# Patient Record
Sex: Female | Born: 1990 | Race: Black or African American | Hispanic: No | Marital: Married | Smoking: Never smoker
Health system: Southern US, Community
[De-identification: ages and names within clinical notes are randomized; demographics above are authoritative.]

## PROBLEM LIST (undated history)

## (undated) ENCOUNTER — Inpatient Hospital Stay (HOSPITAL_COMMUNITY): Payer: Self-pay

## (undated) DIAGNOSIS — D649 Anemia, unspecified: Secondary | ICD-10-CM

## (undated) HISTORY — PX: TONSILLECTOMY: SUR1361

---

## 2016-02-14 ENCOUNTER — Inpatient Hospital Stay (HOSPITAL_COMMUNITY): Payer: Medicaid Other

## 2016-02-14 ENCOUNTER — Encounter (HOSPITAL_COMMUNITY): Payer: Self-pay

## 2016-02-14 ENCOUNTER — Inpatient Hospital Stay (HOSPITAL_COMMUNITY)
Admission: AD | Admit: 2016-02-14 | Discharge: 2016-02-14 | Disposition: A | Discharge status: Home / Self Care | Payer: Medicaid Other | Source: Ambulatory Visit | Attending: Obstetrics & Gynecology | Admitting: Obstetrics & Gynecology

## 2016-02-14 DIAGNOSIS — R102 Pelvic and perineal pain: Secondary | ICD-10-CM | POA: Diagnosis present

## 2016-02-14 DIAGNOSIS — R109 Unspecified abdominal pain: Secondary | ICD-10-CM

## 2016-02-14 DIAGNOSIS — O26891 Other specified pregnancy related conditions, first trimester: Secondary | ICD-10-CM | POA: Diagnosis not present

## 2016-02-14 DIAGNOSIS — N8311 Corpus luteum cyst of right ovary: Secondary | ICD-10-CM | POA: Insufficient documentation

## 2016-02-14 DIAGNOSIS — Z3A01 Less than 8 weeks gestation of pregnancy: Secondary | ICD-10-CM | POA: Diagnosis present

## 2016-02-14 DIAGNOSIS — O26899 Other specified pregnancy related conditions, unspecified trimester: Secondary | ICD-10-CM

## 2016-02-14 LAB — URINE MICROSCOPIC-ADD ON

## 2016-02-14 LAB — URINALYSIS, ROUTINE W REFLEX MICROSCOPIC
GLUCOSE, UA: NEGATIVE mg/dL
Ketones, ur: 15 mg/dL — AB
Leukocytes, UA: NEGATIVE
Nitrite: NEGATIVE
PROTEIN: NEGATIVE mg/dL
pH: 6 (ref 5.0–8.0)

## 2016-02-14 LAB — POCT PREGNANCY, URINE: PREG TEST UR: POSITIVE — AB

## 2016-02-14 LAB — HCG, QUANTITATIVE, PREGNANCY: hCG, Beta Chain, Quant, S: 735 m[IU]/mL — ABNORMAL HIGH (ref <5)

## 2016-02-14 NOTE — MAU Provider Note (Signed)
History   [id number]   Chief Complaint  Patient presents with  . Possible Pregnancy    HPI Sabrina Dean is a 25 y.o. female  G5P4004 at 414w3d by LMP here with report of lower pelvic pain x 2 days.  Pain is described as cramping and mild.  Denies vaginal discharge or vaginal bleeding.  Utilizes Natural Family Planning method for birth control.    Patient's last menstrual period was 01/14/2016 (exact date).  OB History  Gravida Para Term Preterm AB Living  5 4 4     4   SAB TAB Ectopic Multiple Live Births          4    # Outcome Date GA Lbr Len/2nd Weight Sex Delivery Anes PTL Lv  5 Current           4 Term 11/21/13    M CS-LTranv   LIV  3 Term 09/07/12    M CS-LTranv   LIV  2 Term 03/06/09    M CS-LTranv   LIV  1 Term 07/15/06    M CS-LTranv   LIV      No past medical history on file.  No family history on file.  Social History   Social History  . Marital status: Married    Spouse name: N/A  . Number of children: N/A  . Years of education: N/A   Social History Main Topics  . Smoking status: Not on file  . Smokeless tobacco: Not on file  . Alcohol use Not on file  . Drug use: Unknown  . Sexual activity: Not on file   Other Topics Concern  . Not on file   Social History Narrative  . No narrative on file    No Known Allergies  No current facility-administered medications on file prior to encounter.    No current outpatient prescriptions on file prior to encounter.     Review of Systems  Constitutional: Negative for fever.  Gastrointestinal: Negative for constipation, nausea and vomiting.  Genitourinary: Positive for pelvic pain (cramping). Negative for dysuria, flank pain, frequency and vaginal bleeding.  Neurological: Negative for headaches.  All other systems reviewed and are negative.    Physical Exam   Vitals:   02/14/16 2218  BP: 115/66  Pulse: 70  Resp: 16  Temp: 98.3 F (36.8 C)  TempSrc: Oral  SpO2: 99%  Physical Exam  Constitutional: She is oriented to person, place, and time. She appears well-developed and well-nourished. No distress.  HENT:  Head: Normocephalic.  Neck: Normal range of motion. Neck supple.  Cardiovascular: Normal rate, regular rhythm and normal heart sounds.   Respiratory: Effort normal and breath sounds normal. No respiratory distress.  GI: Soft. She exhibits no mass. There is no tenderness. There is no rebound and no guarding.  Genitourinary: Uterus is enlarged. Right adnexum displays no mass, no tenderness and no fullness. Left adnexum displays no mass, no tenderness and no fullness. No bleeding in the vagina.  Musculoskeletal: Normal range of motion.  Neurological: She is alert and oriented to person, place, and time.  Skin: Skin is warm and dry.    MAU Course  Procedures  MDM Results for orders placed or performed during the hospital encounter of 02/14/16 (from the past 24 hour(s))  Urinalysis, Routine w reflex microscopic (not at [facility])     Status: Abnormal   Collection Time: 02/14/16  7:56 PM  Result Value Ref Range   Color, Urine YELLOW YELLOW   APPearance CLEAR CLEAR   Specific Gravity, Urine >1.030 (H) 1.005 - 1.030   pH 6.0 5.0 - 8.0   Glucose, UA NEGATIVE NEGATIVE mg/dL   Hgb urine dipstick TRACE (A) NEGATIVE   Bilirubin Urine SMALL (A) NEGATIVE   Ketones, ur 15 (A) NEGATIVE mg/dL   Protein, ur NEGATIVE NEGATIVE mg/dL   Nitrite NEGATIVE NEGATIVE   Leukocytes, UA NEGATIVE NEGATIVE  Urine microscopic-add on     Status: Abnormal   Collection Time: 02/14/16  7:56 PM  Result Value Ref Range   Squamous Epithelial / LPF 6-30 (A) NONE SEEN   WBC, UA 0-5 0 - 5 WBC/hpf   RBC / HPF 0-5 0 - 5 RBC/hpf   Bacteria, UA FEW (A)  NONE SEEN  Pregnancy, urine POC     Status: Abnormal   Collection Time: 02/14/16  8:19 PM  Result Value Ref Range   Preg Test, Ur POSITIVE (A) NEGATIVE  hCG, quantitative, pregnancy     Status: Abnormal   Collection Time: 02/14/16  9:11 PM  Result Value Ref Range   hCG, Beta Chain, Quant, S 735 (H) <5 mIU/mL    Ultrasound: IMPRESSION: No visible gestational sac. No abnormal pelvic fluid collections. Careful follow-up is necessary.  Assessment and Plan  Pregnancy of Unknown Location  Plan: [facility] Wednesday morning for follow-up BHCG. Reviewed ectopic precautions   Marlis EdelsonWalidah N Karim, CNM 02/14/2016 8:43 PM

## 2016-02-14 NOTE — Discharge Instructions (Signed)

## 2016-02-14 NOTE — MAU Note (Signed)
Pt presents stating she thinks she might be pregnant and is having cramps. LMP 01/14/16. Positive pregnancy test at home. Denies other vaginal discharge.

## 2016-02-17 ENCOUNTER — Telehealth: Payer: Self-pay

## 2016-02-17 ENCOUNTER — Ambulatory Visit: Payer: Medicaid Other

## 2016-02-17 NOTE — Telephone Encounter (Signed)
Called patient to advise her she miss her appointment today. I have rescheduled her appointment for repeat STAT for 02/18/2016. Patient verbalizes understanding.

## 2016-02-18 ENCOUNTER — Telehealth: Payer: Self-pay | Admitting: General Practice

## 2016-02-18 ENCOUNTER — Other Ambulatory Visit: Payer: Medicaid Other | Admitting: General Practice

## 2016-02-18 DIAGNOSIS — O3680X0 Pregnancy with inconclusive fetal viability, not applicable or unspecified: Secondary | ICD-10-CM

## 2016-02-18 LAB — HCG, QUANTITATIVE, PREGNANCY: HCG, BETA CHAIN, QUANT, S: 4308 m[IU]/mL — AB (ref <5)

## 2016-02-18 NOTE — Telephone Encounter (Signed)
Called patient with bhcg results & informed her of need for ultrasound & ultrasound appt 11/28 @ 11am. Patient verbalized understanding to all & had no questions

## 2016-02-18 NOTE — Progress Notes (Signed)
Patient here for stat bhcg. Patient reports no bleeding and pain has improved but occasionally has cramps like a period. Told patient we will contact her in a couple hours by phone for results. Patient verbalized understanding & had no questions. Spoke with Dr Adrian BlackwaterStinson regarding results who agrees to favorable rise in bhcg levels & need for follow up ultrasound in 10 days. Will call patient with results

## 2016-03-01 ENCOUNTER — Ambulatory Visit (HOSPITAL_COMMUNITY)
Admission: RE | Admit: 2016-03-01 | Discharge: 2016-03-01 | Disposition: A | Discharge status: Home / Self Care | Payer: Medicaid Other | Source: Ambulatory Visit | Attending: Family Medicine | Admitting: Family Medicine

## 2016-03-01 ENCOUNTER — Inpatient Hospital Stay (HOSPITAL_COMMUNITY)
Admission: AD | Admit: 2016-03-01 | Discharge: 2016-03-01 | Disposition: A | Discharge status: Home / Self Care | Payer: Medicaid Other | Source: Ambulatory Visit | Attending: Family Medicine | Admitting: Family Medicine

## 2016-03-01 ENCOUNTER — Encounter (HOSPITAL_COMMUNITY): Payer: Self-pay

## 2016-03-01 DIAGNOSIS — Z5189 Encounter for other specified aftercare: Secondary | ICD-10-CM | POA: Insufficient documentation

## 2016-03-01 DIAGNOSIS — R109 Unspecified abdominal pain: Secondary | ICD-10-CM | POA: Insufficient documentation

## 2016-03-01 DIAGNOSIS — O209 Hemorrhage in early pregnancy, unspecified: Secondary | ICD-10-CM | POA: Insufficient documentation

## 2016-03-01 DIAGNOSIS — O3680X0 Pregnancy with inconclusive fetal viability, not applicable or unspecified: Secondary | ICD-10-CM

## 2016-03-01 DIAGNOSIS — Z3A01 Less than 8 weeks gestation of pregnancy: Secondary | ICD-10-CM | POA: Diagnosis not present

## 2016-03-01 DIAGNOSIS — O26891 Other specified pregnancy related conditions, first trimester: Secondary | ICD-10-CM | POA: Diagnosis not present

## 2016-03-01 LAB — CBC
HEMATOCRIT: 32.1 % — AB (ref 36.0–46.0)
HEMOGLOBIN: 10.3 g/dL — AB (ref 12.0–15.0)
MCH: 26.3 pg (ref 26.0–34.0)
MCHC: 32.1 g/dL (ref 30.0–36.0)
MCV: 82.1 fL (ref 78.0–100.0)
Platelets: 349 10*3/uL (ref 150–400)
RBC: 3.91 MIL/uL (ref 3.87–5.11)
RDW: 16 % — AB (ref 11.5–15.5)
WBC: 7.1 10*3/uL (ref 4.0–10.5)

## 2016-03-01 LAB — ABO/RH: ABO/RH(D): O POS

## 2016-03-01 LAB — HCG, QUANTITATIVE, PREGNANCY: hCG, Beta Chain, Quant, S: 698 m[IU]/mL — ABNORMAL HIGH (ref <5)

## 2016-03-01 NOTE — MAU Provider Note (Signed)
History   [id number]   Chief Complaint  Patient presents with  . Follow-up    HPI Sabrina Dean is a 25 y.o. female [phone] here for follow-up. Patient was initially seen in MAU on 11/12 for abdominal cramping in pregnancy. BHCG that day was 735 & ultrasound shows no IUP or adnexal mass. [facility] on 11/16, which was 4308. Outpatient ultrasound for viability scheduled for today. Per ultrasonographer; pt took abortion pill & should be seen in MAU prior to having an ultrasound performed.  Per patient; [facility] where they confirmed she was [redacted] weeks pregnant by ultrasound. States she took the pills that day and had an episode of heavy bleeding the next day when she thought she passed the pregnancy. Reports light bleeding since then. Denies abdominal pain or fever.   Patient's last menstrual period was 01/14/2016 (exact date).  OB History  Gravida Para Term Preterm AB Living  5 4 4     4   SAB TAB Ectopic Multiple Live Births          4    # Outcome Date GA Lbr Len/2nd Weight Sex Delivery Anes PTL Lv  5 Current           4 Term 11/21/13    M CS-LTranv   LIV  3 Term 09/07/12    M CS-LTranv   LIV  2 Term 03/06/09    M CS-LTranv   LIV  1 Term 07/15/06    M CS-LTranv   LIV     Complications: Failure to Progress in First Stage      No past medical history on file.  No family history on file.  Social History   Social History  . Marital status: Married    Spouse name: N/A  . Number of children: N/A  . Years of education: N/A   Social History Main Topics  . Smoking status: Not on file  . Smokeless tobacco: Not on file  . Alcohol use Not on file  . Drug use: Unknown  . Sexual activity: Not on file   Other Topics Concern  . Not on file   Social History Narrative  . No narrative on file    No Known Allergies  No current facility-administered medications on file prior to encounter.    No current outpatient prescriptions on  file prior to encounter.     Physical Exam   Vitals:   03/01/16 1249  BP: 125/82  Pulse: (!) 55  Resp: 16  Temp: 98.2 F (36.8 C)  TempSrc: Oral    Physical Exam  Nursing note and vitals reviewed. Constitutional: She is oriented to person, place, and time. She appears well-developed and well-nourished. No distress.  HENT:  Head: Normocephalic and atraumatic.  Eyes: Conjunctivae are normal. Right eye exhibits no discharge. Left eye exhibits no discharge. No scleral icterus.  Neck: Normal range of motion.  Respiratory: Effort normal. No respiratory distress.  GI: Soft. There is no tenderness.  Neurological: She is alert and oriented to person, place, and time.  Skin: Skin is warm and dry. She is not diaphoretic.  Psychiatric: She has a normal mood and affect. Her behavior is normal. Judgment and thought content normal.    MAU Course  Procedures Results for orders placed or performed during the hospital encounter of 03/01/16 (from the past 24 hour(s))  hCG, quantitative, pregnancy     Status: Abnormal   Collection Time: 03/01/16  1:07 PM  Result Value Ref Range   hCG, Beta Chain, Quant, S 698 (H) <5 mIU/mL  CBC     Status: Abnormal   Collection Time: 03/01/16  1:07 PM  Result Value Ref Range   WBC 7.1 4.0 - 10.5 K/uL   RBC 3.91 3.87 - 5.11 MIL/uL   Hemoglobin 10.3 (L) 12.0 - 15.0 g/dL   HCT 16.132.1 (L) 09.636.0 - 04.546.0 %   MCV 82.1 78.0 - 100.0 fL   MCH 26.3 26.0 - 34.0 pg   MCHC 32.1 30.0 - 36.0 g/dL   RDW 40.916.0 (H) 81.111.5 - 91.415.5 %   Platelets 349 150 - 400 K/uL  ABO/Rh     Status: None   Collection Time: 03/01/16  1:07 PM  Result Value Ref Range   ABO/RH(D) O POS     MDM CBC, abo/rh, BHCG O positive BHCG has dropped from previous visit. Will have pt f/u in clinic for BHCG in 1 week Assessment and Plan  25 y.o. [id number] at 3881w5d wks Pregnancy Follow-up BHCG 1. Vaginal bleeding in pregnancy, first trimester   2. Follow-up visit after therapeutic abortion     P: Discharge home Discussed reasons to return to MAU Pelvic rest F/u bhcg 12/5   Judeth HornErin Sahalie Beth, NP 03/01/2016 12:50 PM

## 2016-03-01 NOTE — MAU Note (Signed)
Feeling well.Here regarding f/u US.  Took medication on 11/21. ([facility]- abortion by pill)

## 2016-03-01 NOTE — Discharge Instructions (Signed)
Return to care  °· If you have heavier bleeding that soaks through more that 2 pads per hour for an hour or more °· If you bleed so much that you feel like you might pass out or you do pass out °· If you have significant abdominal pain that is not improved with Tylenol  °· If you develop a fever > 100.5 ° °

## 2016-03-08 ENCOUNTER — Ambulatory Visit: Payer: Medicaid Other

## 2016-03-08 DIAGNOSIS — O3680X0 Pregnancy with inconclusive fetal viability, not applicable or unspecified: Secondary | ICD-10-CM

## 2016-03-08 LAB — HCG, QUANTITATIVE, PREGNANCY: hCG, Beta Chain, Quant, S: 313 m[IU]/mL — ABNORMAL HIGH (ref <5)

## 2016-03-08 NOTE — Progress Notes (Signed)
Patient presented to office today for STAT BHCG. Patient stated she is having some mild cramping at this time. Results reviewed with provider who suggest patient come back in next week for a repeat quant check. Patient has been schedule and voice understanding.

## 2016-03-11 ENCOUNTER — Other Ambulatory Visit: Payer: Medicaid Other

## 2016-03-11 DIAGNOSIS — O3680X0 Pregnancy with inconclusive fetal viability, not applicable or unspecified: Secondary | ICD-10-CM

## 2016-03-11 NOTE — Progress Notes (Signed)
Patient came in for a repeat beta. Patient reports no pain at this time.

## 2016-03-12 LAB — HCG, QUANTITATIVE, PREGNANCY: hCG, Beta Chain, Quant, S: 477.7 m[IU]/mL — ABNORMAL HIGH

## 2016-03-14 ENCOUNTER — Telehealth: Payer: Self-pay | Admitting: General Practice

## 2016-03-14 NOTE — Telephone Encounter (Signed)
Patient called into front office wanting test results & guidance on next steps. Spoke with Dr Vergie LivingPickens who advises patient follow up with [facility]. Informed patient of next tests & recommendation. Patient verbalized understanding & asked how they would see our doctor's notes. Told patient she can pull up the results via mychart and show them or she can also come by the front office during office hours and request those records. Patient verbalized understanding & had no questions

## 2016-04-08 ENCOUNTER — Ambulatory Visit: Payer: Medicaid Other | Admitting: Family Medicine

## 2016-05-04 ENCOUNTER — Encounter: Payer: Self-pay | Admitting: Family Medicine

## 2016-05-04 ENCOUNTER — Ambulatory Visit (INDEPENDENT_AMBULATORY_CARE_PROVIDER_SITE_OTHER): Payer: Medicaid Other | Admitting: Family Medicine

## 2016-05-04 ENCOUNTER — Other Ambulatory Visit (HOSPITAL_COMMUNITY)
Admission: RE | Admit: 2016-05-04 | Discharge: 2016-05-04 | Disposition: A | Discharge status: Home / Self Care | Payer: Medicaid Other | Source: Ambulatory Visit | Attending: Family Medicine | Admitting: Family Medicine

## 2016-05-04 VITALS — BP 104/46 | HR 58 | Ht 60.0 in | Wt 179.0 lb

## 2016-05-04 DIAGNOSIS — Z01419 Encounter for gynecological examination (general) (routine) without abnormal findings: Secondary | ICD-10-CM

## 2016-05-04 DIAGNOSIS — Z302 Encounter for sterilization: Secondary | ICD-10-CM | POA: Insufficient documentation

## 2016-05-04 DIAGNOSIS — Z3009 Encounter for other general counseling and advice on contraception: Secondary | ICD-10-CM

## 2016-05-04 DIAGNOSIS — Z124 Encounter for screening for malignant neoplasm of cervix: Secondary | ICD-10-CM

## 2016-05-04 DIAGNOSIS — Z0000 Encounter for general adult medical examination without abnormal findings: Secondary | ICD-10-CM

## 2016-05-04 NOTE — Patient Instructions (Signed)
Laparoscopic Tubal Ligation Introduction Laparoscopic tubal ligation is a procedure to close the fallopian tubes. This is done so that you cannot get pregnant. When the fallopian tubes are closed, the eggs that your ovaries release cannot enter the uterus, and sperm cannot reach the released eggs. A laparoscopic tubal ligation is sometimes called "getting your tubes tied." You should not have this procedure if you want to get pregnant someday or if you are unsure about having more children. Tell a health care provider about:  Any allergies you have.  All medicines you are taking, including vitamins, herbs, eye drops, creams, and over-the-counter medicines.  Any problems you or family members have had with anesthetic medicines.  Any blood disorders you have.  Any surgeries you have had.  Any medical conditions you have.  Whether you are pregnant or may be pregnant.  Any past pregnancies. What are the risks? Generally, this is a safe procedure. However, problems may occur, including:  Infection.  Bleeding.  Injury to surrounding organs.  Side effects from anesthetics.  Failure of the procedure. This procedure can increase your risk of a kind of pregnancy in which a fertilized egg attaches to the outside of the uterus (ectopic pregnancy). What happens before the procedure?  Ask your health care provider about:  Changing or stopping your regular medicines. This is especially important if you are taking diabetes medicines or blood thinners.  Taking medicines such as aspirin and ibuprofen. These medicines can thin your blood. Do not take these medicines before your procedure if your health care provider instructs you not to.  Follow instructions from your health care provider about eating and drinking restrictions.  Plan to have someone take you home after the procedure.  If you go home right after the procedure, plan to have someone with you for 24 hours. What happens during  the procedure?  You will be given one or more of the following:  A medicine to help you relax (sedative).  A medicine to numb the area (local anesthetic).  A medicine to make you fall asleep (general anesthetic).  A medicine that is injected into an area of your body to numb everything below the injection site (regional anesthetic).  An IV tube will be inserted into one of your veins. It will be used to give you medicines and fluids during the procedure.  Your bladder may be emptied with a small tube (catheter).  If you have been given a general anesthetic, a tube will be put down your throat to help you breathe.  Two small cuts (incisions) will be made in your lower abdomen and near your belly button.  Your abdomen will be inflated with a gas. This will let the surgeon see better and will give the surgeon room to work.  A thin, lighted tube (laparoscope) with a camera attached will be inserted into your abdomen through one of the incisions. Small instruments will be inserted through the other incision.  The fallopian tubes will be tied off, burned (cauterized), or blocked with a clip, ring, or clamp. A small portion in the center of each fallopian tube may be removed.  The gas will be released from the abdomen.  The incisions will be closed with stitches (sutures).  A bandage (dressing) will be placed over the incisions. The procedure may vary among health care providers and hospitals. What happens after the procedure?  Your blood pressure, heart rate, breathing rate, and blood oxygen level will be monitored often until the medicines you  were given have worn off.  You will be given medicine to help with pain, nausea, and vomiting as needed. This information is not intended to replace advice given to you by your health care provider. Make sure you discuss any questions you have with your health care provider. Document Released: 06/27/2000 Document Revised: 08/27/2015 Document  Reviewed: 03/01/2015  2017 Elsevier Preventive Care 18-39 Years, Female Preventive care refers to lifestyle choices and visits with your health care provider that can promote health and wellness. What does preventive care include?  A yearly physical exam. This is also called an annual well check.  Dental exams once or twice a year.  Routine eye exams. Ask your health care provider how often you should have your eyes checked.  Personal lifestyle choices, including:  Daily care of your teeth and gums.  Regular physical activity.  Eating a healthy diet.  Avoiding tobacco and drug use.  Limiting alcohol use.  Practicing safe sex.  Taking vitamin and mineral supplements as recommended by your health care provider. What happens during an annual well check? The services and screenings done by your health care provider during your annual well check will depend on your age, overall health, lifestyle risk factors, and family history of disease. Counseling  Your health care provider may ask you questions about your:  Alcohol use.  Tobacco use.  Drug use.  Emotional well-being.  Home and relationship well-being.  Sexual activity.  Eating habits.  Work and work Statistician.  Method of birth control.  Menstrual cycle.  Pregnancy history. Screening  You may have the following tests or measurements:  Height, weight, and BMI.  Diabetes screening. This is done by checking your blood sugar (glucose) after you have not eaten for a while (fasting).  Blood pressure.  Lipid and cholesterol levels. These may be checked every 5 years starting at age 63.  Skin check.  Hepatitis C blood test.  Hepatitis B blood test.  Sexually transmitted disease (STD) testing.  BRCA-related cancer screening. This may be done if you have a family history of breast, ovarian, tubal, or peritoneal cancers.  Pelvic exam and Pap test. This may be done every 3 years starting at age 32.  Starting at age 49, this may be done every 5 years if you have a Pap test in combination with an HPV test. Discuss your test results, treatment options, and if necessary, the need for more tests with your health care provider. Vaccines  Your health care provider may recommend certain vaccines, such as:  Influenza vaccine. This is recommended every year.  Tetanus, diphtheria, and acellular pertussis (Tdap, Td) vaccine. You may need a Td booster every 10 years.  Varicella vaccine. You may need this if you have not been vaccinated.  HPV vaccine. If you are 69 or younger, you may need three doses over 6 months.  Measles, mumps, and rubella (MMR) vaccine. You may need at least one dose of MMR. You may also need a second dose.  Pneumococcal 13-valent conjugate (PCV13) vaccine. You may need this if you have certain conditions and were not previously vaccinated.  Pneumococcal polysaccharide (PPSV23) vaccine. You may need one or two doses if you smoke cigarettes or if you have certain conditions.  Meningococcal vaccine. One dose is recommended if you are age 77-21 years and a first-year college student living in a residence hall, or if you have one of several medical conditions. You may also need additional booster doses.  Hepatitis A vaccine. You may need  this if you have certain conditions or if you travel or work in places where you may be exposed to hepatitis A.  Hepatitis B vaccine. You may need this if you have certain conditions or if you travel or work in places where you may be exposed to hepatitis B.  Haemophilus influenzae type b (Hib) vaccine. You may need this if you have certain risk factors. Talk to your health care provider about which screenings and vaccines you need and how often you need them. This information is not intended to replace advice given to you by your health care provider. Make sure you discuss any questions you have with your health care provider. Document  Released: 05/17/2001 Document Revised: 12/09/2015 Document Reviewed: 01/20/2015 Elsevier Interactive Patient Education  2017 [employer].

## 2016-05-04 NOTE — Progress Notes (Signed)
   Subjective:     Sabrina Dean is a 26 y.o. female and is here for a comprehensive physical exam. The patient reports no problems. She needs annual exam and would like permanent sterilization.  Social History   Social History  . Marital status: Married    Spouse name: N/A  . Number of children: N/A  . Years of education: N/A   Occupational History  . Not on file.   Social History Main Topics  . Smoking status: Never Smoker  . Smokeless tobacco: Never Used  . Alcohol use No  . Drug use: No  . Sexual activity: Not on file   Other Topics Concern  . Not on file   Social History Narrative  . No narrative on file   Health Maintenance  Topic Date Due  . HIV Screening  11/15/2003  . TETANUS/TDAP  11/15/2007  . PAP SMEAR  11/14/2009  . INFLUENZA VACCINE  11/03/2015    The following portions of the patient's history were reviewed and updated as appropriate: allergies, current medications, past family history, past medical history, past social history, past surgical history and problem list.  Review of Systems Pertinent items noted in HPI and remainder of comprehensive ROS otherwise negative.   Objective:    BP (!) 104/46   Pulse (!) 58   Ht 5' (1.524 m)   Wt 179 lb (81.2 kg)   LMP 01/14/2016 (Exact Date)   Breastfeeding? No   BMI 34.96 kg/m  General appearance: alert, cooperative and appears stated age Neck: no adenopathy, supple, symmetrical, trachea midline and thyroid not enlarged, symmetric, no tenderness/mass/nodules Lungs: clear to auscultation bilaterally Breasts: normal appearance, no masses or tenderness Heart: regular rate and rhythm, S1, S2 normal, no murmur, click, rub or gallop Abdomen: soft, non-tender; bowel sounds normal; no masses,  no organomegaly Pelvic: cervix normal in appearance, external genitalia normal, no adnexal masses or tenderness, no cervical motion tenderness, uterus normal size, shape, and consistency and vagina normal without  discharge Extremities: extremities normal, atraumatic, no cyanosis or edema Pulses: 2+ and symmetric Skin: Skin color, texture, turgor normal. No rashes or lesions Lymph nodes: Cervical, supraclavicular, and axillary nodes normal. Neurologic: Grossly normal    Assessment:    Healthy female exam.      Plan:   Problem List Items Addressed This Visit      Unprioritized   Sterilization consult    Patient counseled, r.e. Risks benefits of BTL, including permanency of procedure, risk of failure(1:100), increased risk of ectopic.  Patient verbalized understanding and desires to proceed Risks include but are not limited to bleeding, infection, injury to surrounding structures, including bowel, bladder and ureters, blood clots, and death.  Likelihood of success is high.        Other Visit Diagnoses    Well woman exam with routine gynecological exam    -  Primary   Relevant Orders   Cytology - PAP   Screening for malignant neoplasm of cervix         Return in about 3 months (around 08/01/2016) for postop check.    See After Visit Summary for Counseling Recommendations

## 2016-05-04 NOTE — Assessment & Plan Note (Signed)
Patient counseled, r.e. Risks benefits of BTL, including permanency of procedure, risk of failure(1:100), increased risk of ectopic.  Patient verbalized understanding and desires to proceed. Risks include but are not limited to bleeding, infection, injury to surrounding structures, including bowel, bladder and ureters, blood clots, and death.  Likelihood of success is high.  

## 2016-05-05 ENCOUNTER — Encounter: Payer: Self-pay | Admitting: *Deleted

## 2016-05-06 LAB — CYTOLOGY - PAP
Adequacy: ABSENT
Diagnosis: NEGATIVE

## 2016-05-11 ENCOUNTER — Encounter (HOSPITAL_COMMUNITY): Payer: Self-pay

## 2016-06-19 ENCOUNTER — Encounter: Payer: Self-pay | Admitting: Family Medicine

## 2016-06-19 NOTE — H&P (Signed)
  Sabrina Dean is an 26 y.o. [id number] female.   Chief Complaint: undesired fertility HPI: Patient desires permanent sterility.  History reviewed. No pertinent past medical history.  Past Surgical History:  Procedure Laterality Date  . CESAREAN SECTION     x 4  . TONSILLECTOMY      History reviewed. No pertinent family history. Social History:  reports that she has never smoked. She has never used smokeless tobacco. She reports that she does not drink alcohol or use drugs.  Allergies: No Known Allergies  No current facility-administered medications on file prior to encounter.    No current outpatient prescriptions on file prior to encounter.    A comprehensive review of systems was negative.  There were no vitals taken for this visit. General appearance: alert, cooperative and appears stated age Head: Normocephalic, without obvious abnormality, atraumatic Neck: supple, symmetrical, trachea midline Lungs: normal effort Heart: regular rate and rhythm Abdomen: soft, non-tender; bowel sounds normal; no masses,  no organomegaly Extremities: extremities normal, atraumatic, no cyanosis or edema Skin: Skin color, texture, turgor normal. No rashes or lesions Neurologic: Grossly normal   Lab Results  Component Value Date   WBC 7.1 03/01/2016   HGB 10.3 (L) 03/01/2016   HCT 32.1 (L) 03/01/2016   MCV 82.1 03/01/2016   PLT 349 03/01/2016     Assessment/Plan Principal Problem:   Encounter for sterilization  For laparoscopic BTL with Filshie clips. Patient counseled, r.e. Risks benefits of BTL, including permanency of procedure, risk of failure(1:100), increased risk of ectopic.  Patient verbalized understanding and desires to proceed  Risks include but are not limited to bleeding, infection, injury to surrounding structures, including bowel, bladder and ureters, blood clots, and death.  Likelihood of success is high.    Reva Boresanya S Mohsen Odenthal 06/19/2016, 4:11 PM

## 2016-06-24 NOTE — Patient Instructions (Signed)
Your procedure is scheduled on:  Tuesday, June 28, 2016  [facility] at:  1:00 PM  Pick up the phone at the desk and dial [phone].  Call this number if you have problems the morning of surgery: [phone].  Remember: Do NOT eat food:  After 6:30 AM day of surgery  Do NOT drink clear liquids after:  10:30 AM day of surgery  Take these medicines the morning of surgery with a SIP OF WATER:  None  Stop ALL herbal medications at this time  Do NOT smoke the day of surgery.  Do NOT wear jewelry (body piercing), metal hair clips/bobby pins, make-up, or nail polish. Do NOT wear lotions, powders, or perfumes.  You may wear deodorant. Do NOT shave for 48 hours prior to surgery. Do NOT bring valuables to the hospital. Contacts, dentures, or bridgework may not be worn into surgery.  Have a responsible adult drive you home and stay with you for 24 hours after your procedure  Bring a copy of your healthcare power of attorney and living will documents.

## 2016-06-27 ENCOUNTER — Encounter (HOSPITAL_COMMUNITY): Payer: Self-pay

## 2016-06-27 ENCOUNTER — Encounter (HOSPITAL_COMMUNITY)
Admission: RE | Admit: 2016-06-27 | Discharge: 2016-06-27 | Disposition: A | Discharge status: Home / Self Care | Payer: Medicaid Other | Source: Ambulatory Visit | Attending: Family Medicine | Admitting: Family Medicine

## 2016-06-27 DIAGNOSIS — D649 Anemia, unspecified: Secondary | ICD-10-CM | POA: Diagnosis not present

## 2016-06-27 DIAGNOSIS — Z01812 Encounter for preprocedural laboratory examination: Secondary | ICD-10-CM | POA: Insufficient documentation

## 2016-06-27 HISTORY — DX: Anemia, unspecified: D64.9

## 2016-06-27 LAB — CBC
HCT: 32.5 % — ABNORMAL LOW (ref 36.0–46.0)
HEMOGLOBIN: 10.8 g/dL — AB (ref 12.0–15.0)
MCH: 25.5 pg — ABNORMAL LOW (ref 26.0–34.0)
MCHC: 33.2 g/dL (ref 30.0–36.0)
MCV: 76.8 fL — ABNORMAL LOW (ref 78.0–100.0)
Platelets: 308 10*3/uL (ref 150–400)
RBC: 4.23 MIL/uL (ref 3.87–5.11)
RDW: 17 % — ABNORMAL HIGH (ref 11.5–15.5)
WBC: 6.9 10*3/uL (ref 4.0–10.5)

## 2016-06-27 NOTE — Pre-Procedure Instructions (Signed)
Sabrina Dean has just completed a 16 day religious fast (she has consumed no food during this time just water).

## 2016-06-28 ENCOUNTER — Ambulatory Visit (HOSPITAL_COMMUNITY)
Admission: RE | Admit: 2016-06-28 | Discharge: 2016-06-28 | Disposition: A | Discharge status: Home / Self Care | Payer: Medicaid Other | Source: Ambulatory Visit | Attending: Family Medicine | Admitting: Family Medicine

## 2016-06-28 ENCOUNTER — Ambulatory Visit (HOSPITAL_COMMUNITY): Payer: Medicaid Other | Admitting: Anesthesiology

## 2016-06-28 ENCOUNTER — Encounter (HOSPITAL_COMMUNITY)
Admission: RE | Disposition: A | Discharge status: Home / Self Care | Payer: Self-pay | Source: Ambulatory Visit | Attending: Family Medicine

## 2016-06-28 ENCOUNTER — Encounter (HOSPITAL_COMMUNITY): Payer: Self-pay | Admitting: Anesthesiology

## 2016-06-28 DIAGNOSIS — Z641 Problems related to multiparity: Secondary | ICD-10-CM | POA: Diagnosis not present

## 2016-06-28 DIAGNOSIS — Z302 Encounter for sterilization: Secondary | ICD-10-CM

## 2016-06-28 HISTORY — PX: LAPAROSCOPIC TUBAL LIGATION: SHX1937

## 2016-06-28 LAB — PREGNANCY, URINE: PREG TEST UR: NEGATIVE

## 2016-06-28 SURGERY — LIGATION, FALLOPIAN TUBE, LAPAROSCOPIC
Anesthesia: General | Site: Abdomen | Laterality: Bilateral

## 2016-06-28 MED ORDER — LIDOCAINE HCL (CARDIAC) 20 MG/ML IV SOLN
INTRAVENOUS | Status: AC
  Filled 2016-06-28: qty 5

## 2016-06-28 MED ORDER — PROPOFOL 10 MG/ML IV BOLUS
INTRAVENOUS | Status: AC
  Filled 2016-06-28: qty 20

## 2016-06-28 MED ORDER — GLYCOPYRROLATE 0.2 MG/ML IJ SOLN
INTRAMUSCULAR | Status: DC | PRN
  Administered 2016-06-28: 0.1 mg via INTRAVENOUS
  Administered 2016-06-28: 0.2 mg via INTRAVENOUS

## 2016-06-28 MED ORDER — KETOROLAC TROMETHAMINE 30 MG/ML IJ SOLN: 30.0000 mg | Freq: Once | INTRAMUSCULAR | Status: DC | PRN

## 2016-06-28 MED ORDER — NEOSTIGMINE METHYLSULFATE 10 MG/10ML IV SOLN
INTRAVENOUS | Status: AC
  Filled 2016-06-28: qty 1

## 2016-06-28 MED ORDER — MIDAZOLAM HCL 2 MG/2ML IJ SOLN
INTRAMUSCULAR | Status: AC
  Filled 2016-06-28: qty 2

## 2016-06-28 MED ORDER — DEXAMETHASONE SODIUM PHOSPHATE 10 MG/ML IJ SOLN
INTRAMUSCULAR | Status: AC
  Filled 2016-06-28: qty 1

## 2016-06-28 MED ORDER — PROPOFOL 10 MG/ML IV BOLUS
INTRAVENOUS | Status: AC
Start: 2016-06-28 — End: ?
  Filled 2016-06-28: qty 20

## 2016-06-28 MED ORDER — GLYCOPYRROLATE 0.2 MG/ML IJ SOLN
INTRAMUSCULAR | Status: AC
  Filled 2016-06-28: qty 1

## 2016-06-28 MED ORDER — ROCURONIUM BROMIDE 100 MG/10ML IV SOLN
INTRAVENOUS | Status: DC | PRN
  Administered 2016-06-28 (×2): 5 mg via INTRAVENOUS

## 2016-06-28 MED ORDER — OXYCODONE-ACETAMINOPHEN 5-325 MG PO TABS: 1.0000 | ORAL_TABLET | Freq: Four times a day (QID) | ORAL | 0 refills | Status: DC | PRN

## 2016-06-28 MED ORDER — KETOROLAC TROMETHAMINE 30 MG/ML IJ SOLN
INTRAMUSCULAR | Status: AC
  Filled 2016-06-28: qty 1

## 2016-06-28 MED ORDER — OXYCODONE-ACETAMINOPHEN 5-325 MG PO TABS
ORAL_TABLET | ORAL | Status: AC
  Filled 2016-06-28: qty 1

## 2016-06-28 MED ORDER — ONDANSETRON HCL 4 MG/2ML IJ SOLN
INTRAMUSCULAR | Status: AC
  Filled 2016-06-28: qty 2

## 2016-06-28 MED ORDER — LACTATED RINGERS IV SOLN
INTRAVENOUS | Status: DC
  Administered 2016-06-28 (×2): via INTRAVENOUS

## 2016-06-28 MED ORDER — HYDROMORPHONE HCL 1 MG/ML IJ SOLN
0.2500 mg | INTRAMUSCULAR | Status: DC | PRN
  Administered 2016-06-28 (×2): 0.5 mg via INTRAVENOUS

## 2016-06-28 MED ORDER — BUPIVACAINE HCL (PF) 0.25 % IJ SOLN
INTRAMUSCULAR | Status: AC
  Filled 2016-06-28: qty 30

## 2016-06-28 MED ORDER — ROCURONIUM BROMIDE 100 MG/10ML IV SOLN
INTRAVENOUS | Status: AC
  Filled 2016-06-28: qty 1

## 2016-06-28 MED ORDER — OXYCODONE HCL 5 MG PO TABS: 5.0000 mg | ORAL_TABLET | Freq: Once | ORAL | Status: DC | PRN

## 2016-06-28 MED ORDER — PROPOFOL 10 MG/ML IV BOLUS
INTRAVENOUS | Status: DC | PRN
  Administered 2016-06-28: 180 mg via INTRAVENOUS
  Administered 2016-06-28: 25 mg via INTRAVENOUS
  Administered 2016-06-28: 20 mg via INTRAVENOUS

## 2016-06-28 MED ORDER — MEPERIDINE HCL 25 MG/ML IJ SOLN: 6.2500 mg | INTRAMUSCULAR | Status: DC | PRN

## 2016-06-28 MED ORDER — OXYCODONE HCL 5 MG/5ML PO SOLN: 5.0000 mg | Freq: Once | ORAL | Status: DC | PRN

## 2016-06-28 MED ORDER — HYDROMORPHONE HCL 1 MG/ML IJ SOLN
INTRAMUSCULAR | Status: AC
  Administered 2016-06-28: 0.5 mg via INTRAVENOUS
  Filled 2016-06-28: qty 1

## 2016-06-28 MED ORDER — SCOPOLAMINE 1 MG/3DAYS TD PT72
MEDICATED_PATCH | TRANSDERMAL | Status: AC
  Administered 2016-06-28: 1.5 mg via TRANSDERMAL
  Filled 2016-06-28: qty 1

## 2016-06-28 MED ORDER — KETOROLAC TROMETHAMINE 30 MG/ML IJ SOLN
INTRAMUSCULAR | Status: DC | PRN
  Administered 2016-06-28: 30 mg via INTRAVENOUS

## 2016-06-28 MED ORDER — METOCLOPRAMIDE HCL 5 MG/ML IJ SOLN: 10.0000 mg | Freq: Once | INTRAMUSCULAR | Status: DC | PRN

## 2016-06-28 MED ORDER — ONDANSETRON HCL 4 MG/2ML IJ SOLN
INTRAMUSCULAR | Status: DC | PRN
  Administered 2016-06-28: 4 mg via INTRAVENOUS

## 2016-06-28 MED ORDER — FENTANYL CITRATE (PF) 100 MCG/2ML IJ SOLN
INTRAMUSCULAR | Status: DC | PRN
  Administered 2016-06-28 (×2): 50 ug via INTRAVENOUS
  Administered 2016-06-28: 100 ug via INTRAVENOUS

## 2016-06-28 MED ORDER — SUCCINYLCHOLINE CHLORIDE 200 MG/10ML IV SOSY
PREFILLED_SYRINGE | INTRAVENOUS | Status: AC
  Filled 2016-06-28: qty 10

## 2016-06-28 MED ORDER — SUCCINYLCHOLINE CHLORIDE 20 MG/ML IJ SOLN
INTRAMUSCULAR | Status: DC | PRN
  Administered 2016-06-28: 140 mg via INTRAVENOUS

## 2016-06-28 MED ORDER — FENTANYL CITRATE (PF) 250 MCG/5ML IJ SOLN
INTRAMUSCULAR | Status: AC
  Filled 2016-06-28: qty 5

## 2016-06-28 MED ORDER — BUPIVACAINE HCL (PF) 0.25 % IJ SOLN
INTRAMUSCULAR | Status: DC | PRN
  Administered 2016-06-28: 10 mL

## 2016-06-28 MED ORDER — SCOPOLAMINE 1 MG/3DAYS TD PT72
1.0000 | MEDICATED_PATCH | Freq: Once | TRANSDERMAL | Status: DC
  Administered 2016-06-28: 1.5 mg via TRANSDERMAL

## 2016-06-28 MED ORDER — OXYCODONE-ACETAMINOPHEN 5-325 MG PO TABS
1.0000 | ORAL_TABLET | ORAL | Status: DC | PRN
  Administered 2016-06-28: 1 via ORAL

## 2016-06-28 MED ORDER — DEXAMETHASONE SODIUM PHOSPHATE 10 MG/ML IJ SOLN
INTRAMUSCULAR | Status: DC | PRN
  Administered 2016-06-28: 10 mg via INTRAVENOUS

## 2016-06-28 MED ORDER — LACTATED RINGERS IV SOLN
INTRAVENOUS | Status: DC
  Administered 2016-06-28: 14:00:00 via INTRAVENOUS

## 2016-06-28 MED ORDER — LIDOCAINE HCL (CARDIAC) 20 MG/ML IV SOLN
INTRAVENOUS | Status: DC | PRN
  Administered 2016-06-28: 80 mg via INTRAVENOUS

## 2016-06-28 MED ORDER — MIDAZOLAM HCL 2 MG/2ML IJ SOLN
INTRAMUSCULAR | Status: DC | PRN
  Administered 2016-06-28: 2 mg via INTRAVENOUS

## 2016-06-28 MED ORDER — NEOSTIGMINE METHYLSULFATE 10 MG/10ML IV SOLN
INTRAVENOUS | Status: DC | PRN
Start: 2016-06-28 — End: 2016-06-28
  Administered 2016-06-28: 2 mg via INTRAVENOUS

## 2016-06-28 SURGICAL SUPPLY — 23 items
CATH ROBINSON RED A/P 16FR (CATHETERS) ×3 IMPLANT
CLIP FILSHIE TUBAL LIGA STRL (Clip) ×3 IMPLANT
CLOTH BEACON ORANGE TIMEOUT ST (SAFETY) ×3 IMPLANT
DRSG OPSITE POSTOP 3X4 (GAUZE/BANDAGES/DRESSINGS) ×3 IMPLANT
DURAPREP 26ML APPLICATOR (WOUND CARE) ×3 IMPLANT
GLOVE BIOGEL PI IND STRL 7.0 (GLOVE) ×3 IMPLANT
GLOVE BIOGEL PI INDICATOR 7.0 (GLOVE) ×6
GLOVE ECLIPSE 7.0 STRL STRAW (GLOVE) ×3 IMPLANT
GOWN STRL REUS W/TWL LRG LVL3 (GOWN DISPOSABLE) ×6 IMPLANT
PACK LAPAROSCOPY BASIN (CUSTOM PROCEDURE TRAY) ×3 IMPLANT
PACK TRENDGUARD 450 HYBRID PRO (MISCELLANEOUS) ×1 IMPLANT
PACK TRENDGUARD 600 HYBRD PROC (MISCELLANEOUS) IMPLANT
PROTECTOR NERVE ULNAR (MISCELLANEOUS) ×6 IMPLANT
SLEEVE XCEL OPT CAN 5 100 (ENDOMECHANICALS) ×3 IMPLANT
SUT VIC AB 3-0 X1 27 (SUTURE) ×3 IMPLANT
SUT VICRYL 0 UR6 27IN ABS (SUTURE) ×6 IMPLANT
TOWEL OR 17X24 6PK STRL BLUE (TOWEL DISPOSABLE) ×6 IMPLANT
TRENDGUARD 450 HYBRID PRO PACK (MISCELLANEOUS) ×3
TRENDGUARD 600 HYBRID PROC PK (MISCELLANEOUS)
TROCAR BALLN 12MMX100 BLUNT (TROCAR) ×3 IMPLANT
TROCAR XCEL NON-BLD 5MMX100MML (ENDOMECHANICALS) ×3 IMPLANT
WARMER LAPAROSCOPE (MISCELLANEOUS) ×3 IMPLANT
WATER STERILE IRR 1000ML POUR (IV SOLUTION) ×3 IMPLANT

## 2016-06-28 NOTE — Interval H&P Note (Signed)
History and Physical Interval Note:  06/28/2016 4:43 PM  Sabrina Dean  has presented today for surgery, with the diagnosis of Undesired Fertility [phone]  The various methods of treatment have been discussed with the patient and family. After consideration of risks, benefits and other options for treatment, the patient has consented to  Procedure(s): LAPAROSCOPIC TUBAL LIGATION (Bilateral) as a surgical intervention .  The patient's history has been reviewed, patient examined, no change in status, stable for surgery.  I have reviewed the patient's chart and labs.  Questions were answered to the patient's satisfaction.     Reva Boresanya S Nimco Bivens

## 2016-06-28 NOTE — Anesthesia Preprocedure Evaluation (Signed)
Anesthesia Evaluation  Patient identified by MRN, date of birth, ID band Patient awake    Reviewed: Allergy & Precautions, NPO status , Patient's Chart, lab work & pertinent test results  Airway Mallampati: II  TM Distance: >3 FB Neck ROM: Full    Dental no notable dental hx.    Pulmonary neg pulmonary ROS,    Pulmonary exam normal breath sounds clear to auscultation       Cardiovascular negative cardio ROS Normal cardiovascular exam Rhythm:Regular Rate:Normal     Neuro/Psych negative neurological ROS  negative psych ROS   GI/Hepatic negative GI ROS, Neg liver ROS,   Endo/Other  negative endocrine ROS  Renal/GU negative Renal ROS  negative genitourinary   Musculoskeletal negative musculoskeletal ROS (+)   Abdominal (+) + obese,   Peds negative pediatric ROS (+)  Hematology negative hematology ROS (+)   Anesthesia Other Findings   Reproductive/Obstetrics negative OB ROS                             Anesthesia Physical Anesthesia Plan  ASA: II  Anesthesia Plan: General   Post-op Pain Management:    Induction: Intravenous  Airway Management Planned: Oral ETT  Additional Equipment: None  Intra-op Plan:   Post-operative Plan: Extubation in OR  Informed Consent: I have reviewed the patients History and Physical, chart, labs and discussed the procedure including the risks, benefits and alternatives for the proposed anesthesia with the patient or authorized representative who has indicated his/her understanding and acceptance.   Dental advisory given  Plan Discussed with: CRNA  Anesthesia Plan Comments:         Anesthesia Quick Evaluation

## 2016-06-28 NOTE — Transfer of Care (Signed)
Immediate Anesthesia Transfer of Care Note  Patient: Sabrina Dean  Procedure(s) Performed: Procedure(s): LAPAROSCOPIC TUBAL LIGATION (Bilateral)  Patient Location: PACU  Anesthesia Type:General  Level of Consciousness: awake, alert , oriented and patient cooperative  Airway & Oxygen Therapy: Patient Spontanous Breathing and Patient connected to nasal cannula oxygen  Post-op Assessment: Report given to RN and Post -op Vital signs reviewed and stable  Post vital signs: Reviewed and stable  Last Vitals:  Vitals:   06/28/16 1323  BP: 107/75  Pulse: 72  Resp: 20  Temp: 36.6 C    Last Pain:  Vitals:   06/28/16 1323  TempSrc: Oral      Patients Stated Pain Goal: 3 (06/28/16 1323)  Complications: No apparent anesthesia complications

## 2016-06-28 NOTE — Anesthesia Postprocedure Evaluation (Signed)
Anesthesia Post Note  Patient: Sabrina Dean  Procedure(s) Performed: Procedure(s) (LRB): LAPAROSCOPIC TUBAL LIGATION (Bilateral)  Patient location during evaluation: PACU Anesthesia Type: General Level of consciousness: awake and alert Pain management: pain level controlled Vital Signs Assessment: post-procedure vital signs reviewed and stable Respiratory status: spontaneous breathing, nonlabored ventilation, respiratory function stable and patient connected to nasal cannula oxygen Cardiovascular status: blood pressure returned to baseline and stable Postop Assessment: no signs of nausea or vomiting Anesthetic complications: no        Last Vitals:  Vitals:   06/28/16 1323 06/28/16 1836  BP: 107/75 116/70  Pulse: 72 62  Resp: 20 16  Temp: 36.6 C 36.8 C    Last Pain:  Vitals:   06/28/16 1836  TempSrc:   PainSc: 4    Pain Goal: Patients Stated Pain Goal: 3 (06/28/16 1836)               Cecile HearingStephen Edward Cael Worth

## 2016-06-28 NOTE — Anesthesia Procedure Notes (Signed)
Procedure Name: Intubation Date/Time: 06/28/2016 5:41 PM Performed by: Casimer Lanius A Pre-anesthesia Checklist: Patient identified, Emergency Drugs available, Suction available and Patient being monitored Patient Re-evaluated:Patient Re-evaluated prior to inductionOxygen Delivery Method: Circle system utilized and Simple face mask Preoxygenation: Pre-oxygenation with 100% oxygen Intubation Type: IV induction Ventilation: Mask ventilation without difficulty Laryngoscope Size: Mac and 3 Grade View: Grade II Tube type: Oral Tube size: 7.0 mm Number of attempts: 1 Airway Equipment and Method: Stylet Placement Confirmation: ETT inserted through vocal cords under direct vision,  positive ETCO2 and breath sounds checked- equal and bilateral Secured at: 20 (right lip) cm Tube secured with: Tape Dental Injury: Teeth and Oropharynx as per pre-operative assessment

## 2016-06-28 NOTE — Discharge Instructions (Signed)
Laparoscopic Tubal Ligation, Care After  Please remove nausea patch tomorrow if you feel ok and then if not remove within 3 days. Please wash hands.  Do not take Motrin/Ibuprofen/Advil until after 2am tonight.  Refer to this sheet in the next few weeks. These instructions provide you with information about caring for yourself after your procedure. Your health care provider may also give you more specific instructions. Your treatment has been planned according to current medical practices, but problems sometimes occur. Call your health care provider if you have any problems or questions after your procedure. What can I expect after the procedure? After the procedure, it is common to have:  A sore throat.  Discomfort in your shoulder.  Mild discomfort or cramping in your abdomen.  Gas pains.  Pain or soreness in the area where the surgical cut (incision) was made.  A bloated feeling.  Tiredness.  Nausea.  Vomiting. Follow these instructions at home: Medicines   Take over-the-counter and prescription medicines only as told by your health care provider.  Do not take aspirin because it can cause bleeding.  Do not drive or operate heavy machinery while taking prescription pain medicine. Activity   Rest for the rest of the day.  Return to your normal activities as told by your health care provider. Ask your health care provider what activities are safe for you. Incision care    Follow instructions from your health care provider about how to take care of your incision. Make sure you:  Wash your hands with soap and water before you change your bandage (dressing). If soap and water are not available, use hand sanitizer.  Change your dressing as told by your health care provider.  Leave stitches (sutures) in place. They may need to stay in place for 2 weeks or longer.  Check your incision area every day for signs of infection. Check for:  More redness, swelling, or  pain.  More fluid or blood.  Warmth.  Pus or a bad smell. Other Instructions   Do not take baths, swim, or use a hot tub until your health care provider approves. You may take showers.  Keep all follow-up visits as told by your health care provider. This is important.  Have someone help you with your daily household tasks for the first few days. Contact a health care provider if:  You have more redness, swelling, or pain around your incision.  Your incision feels warm to the touch.  You have pus or a bad smell coming from your incision.  The edges of your incision break open after the sutures have been removed.  Your pain does not improve after 2-3 days.  You have a rash.  You repeatedly become dizzy or light-headed.  Your pain medicine is not helping.  You are constipated. Get help right away if:  You have a fever.  You faint.  You have increasing pain in your abdomen.  You have severe pain in one or both of your shoulders.  You have fluid or blood coming from your sutures or from your vagina.  You have shortness of breath or difficulty breathing.  You have chest pain or leg pain.  You have ongoing nausea, vomiting, or diarrhea. This information is not intended to replace advice given to you by your health care provider. Make sure you discuss any questions you have with your health care provider. Document Released: 10/08/2004 Document Revised: 08/24/2015 Document Reviewed: 03/01/2015 Elsevier Interactive Patient Education  2017 [employer]

## 2016-06-28 NOTE — Op Note (Addendum)
Preoperative diagnosis: Multiparity and Undesired fertility  Postoperative diagnosis: Same  Procedure: Laparoscopic BTL  Surgeon: Tinnie Gensanya Makell Cyr, MD  Assistant: Candelaria CelesteJacob Stinson, MD  Anesthesia: Noralee StainGETT- Stephen Edward Turk, MD  Findings: Normal tubes and ovaries extensive adhesions of the uterus to the anterior abdominal wall. Small adhesion of omentum to the umbilicus  Estimated blood loss: Minimal  Complications: None known  Specimens: None  Reason for procedure: 26 y.o. [id number]  who desires permanent sterility. Patient counseled, r.e. Risks benefits of BTL, including permanency of procedure, risk of failure(1:100), increased risk of ectopic.  Patient verbalized understanding and desires to proceed  Procedure: Patient was taken to the operating room was placed in dorsal lithotomy in [place] stirrups. She was prepped and draped in the usual sterile fashion. A timeout was performed. SCDs were in place. Red rubber catheter is used to drain bladder. Speculum was placed inside the vagina. The cervix was visualized and grasped anteriorly with a single-tooth tenaculum. A Hulka tenaculum was placed through the cervix for uterine manipulation. The single tooth tenaculum and speculum were removed from the vagina.  Attention was then turned to the abdomen. Four cc of 0.25% Marcaine was injected at the umbilicus. Two Allis clamps were used to tent up the skin of the umbilicus a vertical one half centimeter incision was made here. The fascia was incised with the knife  And the peritoneum was entered sharply with this incision. A purse string suture of 0-Vicryl on a UR-6 was placed in the fascia. A 10 mm Hassan trocar was placed through this incision and a pneumoperitoneum was created. The patient was then placed in Trendelenburg. The uterus was identified.  The right and left tubes were identified and followed to their fimbriated ends.  Filshie clips placed across these at 1.5 cm from cornu bilaterally.  Pictures  were made. Pneumoperitoneum let down and instruments removed from abdomen. Previous mentioned purse string suture tied down.  Skin closed with 3-0 Vicryl on an X-1 in a subcuticular fashion. All instrument, needle and lap counts correct.  Patient awakened and taken to recovery in stable condition.   Reva Boresanya S Carmellia Kreisler 06/28/2016 6:13 PM

## 2016-06-29 ENCOUNTER — Encounter (HOSPITAL_COMMUNITY): Payer: Self-pay | Admitting: Family Medicine

## 2016-06-29 ENCOUNTER — Telehealth: Payer: Self-pay | Admitting: General Practice

## 2016-06-29 ENCOUNTER — Encounter (HOSPITAL_COMMUNITY): Payer: Self-pay

## 2016-06-29 ENCOUNTER — Inpatient Hospital Stay (HOSPITAL_COMMUNITY)
Admission: AD | Admit: 2016-06-29 | Discharge: 2016-06-29 | Disposition: A | Discharge status: Home / Self Care | Payer: Medicaid Other | Source: Ambulatory Visit | Attending: Obstetrics & Gynecology | Admitting: Obstetrics & Gynecology

## 2016-06-29 DIAGNOSIS — Z760 Encounter for issue of repeat prescription: Secondary | ICD-10-CM | POA: Insufficient documentation

## 2016-06-29 DIAGNOSIS — R110 Nausea: Secondary | ICD-10-CM | POA: Insufficient documentation

## 2016-06-29 MED ORDER — SCOPOLAMINE 1 MG/3DAYS TD PT72: 1.0000 | MEDICATED_PATCH | TRANSDERMAL | 1 refills | Status: DC

## 2016-06-29 NOTE — MAU Note (Signed)
Pt states that she tubaligation yesterday by Dr. Shawnie PonsPratt. Pt here requesting "nausea patch". States she has not had any vomiting, just constant nausea. Denies pain.

## 2016-06-29 NOTE — MAU Provider Note (Signed)
  History     CSN: [id number]  Arrival date and time: 06/29/16 2326   First Provider Initiated Contact with Patient 06/29/16 2344      Chief Complaint  Patient presents with  . Nausea  . Medication Refill   Sabrina Dean is a 26 y.o. [id number] who is S/P BTL on 06/28/16. She reports that she had a "nausea patch" on, but she took it off. She is having nausea now. She denies any pain or other concerns at this time. Only nausea, and a request for refill on "nausea patch". She denies any vomiting.    Past Medical History:  Diagnosis Date  . Anemia    with pregnancy    Past Surgical History:  Procedure Laterality Date  . CESAREAN SECTION     x 4  . LAPAROSCOPIC TUBAL LIGATION Bilateral 06/28/2016   Procedure: LAPAROSCOPIC TUBAL LIGATION;  Surgeon: Reva Boresanya S Pratt, MD;  Location: [facility];  Service: Gynecology;  Laterality: Bilateral;  . TONSILLECTOMY      No family history on file.  Social History  Substance Use Topics  . Smoking status: Never Smoker  . Smokeless tobacco: Never Used  . Alcohol use No    Allergies: No Known Allergies  Prescriptions Prior to Admission  Medication Sig Dispense Refill Last Dose  . OVER THE COUNTER MEDICATION Take 2 tablets by mouth 2 (two) times daily. Weight loss supplement   Past Week at Unknown time  . oxyCODONE-acetaminophen (PERCOCET/ROXICET) 5-325 MG tablet Take 1-2 tablets by mouth every 6 (six) hours as needed. 10 tablet 0     Review of Systems  Constitutional: Negative for chills and fever.  Gastrointestinal: Positive for nausea. Negative for vomiting.  Genitourinary: Negative for dysuria, hematuria, urgency, vaginal bleeding and vaginal discharge.   Physical Exam   Blood pressure (!) 124/97, pulse (!) 47, temperature 97.9 F (36.6 C), resp. rate 16, last menstrual period 06/12/2016.  Physical Exam  Nursing note and vitals reviewed. Constitutional: She is oriented to person, place, and time. She appears well-developed and  well-nourished. No distress.  HENT:  Head: Normocephalic.  Cardiovascular: Normal rate.   Respiratory: Effort normal.  GI: Soft. There is no tenderness. There is no rebound.  Neurological: She is alert and oriented to person, place, and time.  Skin: Skin is warm and dry.  Psychiatric: She has a normal mood and affect.    MAU Course  Procedures  MDM   Assessment and Plan   1. Nausea without vomiting    DC home Comfort measures reviewed  RX: scopolamine patch 1 q 3 days #1 with 1RF  Return to MAU as needed FU with OB as planned  Follow-up Information    [facility].   Specialty:  Obstetrics and Gynecology Contact information: [address] [place] [id number] [phone]           Tawnya CrookHogan, Travontae Freiberger Donovan 06/29/2016, 11:49 PM

## 2016-06-29 NOTE — Discharge Instructions (Signed)
Nausea, Adult Feeling sick to your stomach (nausea) means that your stomach is upset or you feel like you have to throw up (vomit). Feeling sick to your stomach is usually not serious, but it may be an early sign of a more serious medical problem. As you feel sicker to your stomach, it can lead to throwing up (vomiting). If you throw up, or if you are not able to drink enough fluids, there is a risk of dehydration. Dehydration can make you feel tired and thirsty, have a dry mouth, and pee (urinate) less often. Older adults and people who have other diseases or a weak defense (immune) system have a higher risk of dehydration. The main goal of treating this condition is to:  Limit how often you feel sick to your stomach.  Prevent throwing up and dehydration. Follow these instructions at home: Follow instructions from your doctor about how to care for yourself at home. Eating and drinking  Follow these recommendations as told by your doctor:  Take an oral rehydration solution (ORS). This is a drink that is sold at pharmacies and stores.  Drink clear fluids in small amounts as you are able, such as:  Water.  Ice chips.  Fruit juice that has water added (diluted fruit juice).  Low-calorie sports drinks.  Eat bland, easy to digest foods in small amounts as you are able, such as:  Bananas.  Applesauce.  Rice.  Lean meats.  Toast.  Crackers.  Avoid drinking fluids that contain a lot of sugar or caffeine.  Avoid alcohol.  Avoid spicy or fatty foods. General instructions   Drink enough fluid to keep your pee (urine) clear or pale yellow.  Wash your hands often. If you cannot use soap and water, use hand sanitizer.  Make sure that all people in your household wash their hands well and often.  Rest at home while you get better.  Take over-the-counter and prescription medicines only as told by your doctor.  Breathe slowly and deeply when you feel sick to your  stomach.  Watch your condition for any changes.  Keep all follow-up visits as told by your doctor. This is important. Contact a doctor if:  You have a headache.  You have new symptoms.  You feel sicker to your stomach.  You have a fever.  You feel light-headed or dizzy.  You throw up.  You are not able to keep fluids down. Get help right away if:  You have pain in your chest, neck, arm, or jaw.  You feel very weak or you pass out (faint).  You have throw up that is bright red or looks like coffee grounds.  You have bloody or black poop (stools), or poop that looks like tar.  You have a very bad headache, a stiff neck, or both.  You have very bad pain, cramping, or bloating in your belly.  You have a rash.  You have trouble breathing or you are breathing very quickly.  Your heart is beating very quickly.  Your skin feels cold and clammy.  You feel confused.  You have pain while peeing.  You have signs of dehydration, such as:  Dark pee, or very little or no pee.  Cracked lips.  Dry mouth.  Sunken eyes.  Sleepiness.  Weakness. These symptoms may be an emergency. Do not wait to see if the symptoms will go away. Get medical help right away. Call your local emergency services (911 in the [place] Do not drive yourself to   the hospital. This information is not intended to replace advice given to you by your health care provider. Make sure you discuss any questions you have with your health care provider. Document Released: 03/10/2011 Document Revised: 08/27/2015 Document Reviewed: 11/25/2014 Elsevier Interactive Patient Education  2017 [employer] Inc.  

## 2016-07-01 ENCOUNTER — Encounter (HOSPITAL_COMMUNITY): Payer: Self-pay | Admitting: *Deleted

## 2016-07-01 ENCOUNTER — Inpatient Hospital Stay (HOSPITAL_COMMUNITY)
Admission: AD | Admit: 2016-07-01 | Discharge: 2016-07-02 | Disposition: A | Discharge status: Home / Self Care | Payer: Medicaid Other | Source: Ambulatory Visit | Attending: Obstetrics and Gynecology | Admitting: Obstetrics and Gynecology

## 2016-07-01 DIAGNOSIS — R110 Nausea: Secondary | ICD-10-CM | POA: Insufficient documentation

## 2016-07-01 DIAGNOSIS — R12 Heartburn: Secondary | ICD-10-CM | POA: Diagnosis not present

## 2016-07-01 DIAGNOSIS — K5903 Drug induced constipation: Secondary | ICD-10-CM

## 2016-07-01 DIAGNOSIS — Z9851 Tubal ligation status: Secondary | ICD-10-CM | POA: Insufficient documentation

## 2016-07-01 DIAGNOSIS — R42 Dizziness and giddiness: Secondary | ICD-10-CM | POA: Insufficient documentation

## 2016-07-01 DIAGNOSIS — K5900 Constipation, unspecified: Secondary | ICD-10-CM | POA: Diagnosis present

## 2016-07-01 NOTE — MAU Note (Addendum)
Feeling dizzy, like I want to vomit, and constipated. Able to drink and eat. Last BM today but very small. Dsg still on incision but no excessive tenderness. Some pink vag spotting. Is taking Percocet for pain and last dose 1700

## 2016-07-02 ENCOUNTER — Other Ambulatory Visit: Payer: Self-pay | Admitting: Obstetrics and Gynecology

## 2016-07-02 ENCOUNTER — Encounter (HOSPITAL_COMMUNITY): Payer: Self-pay | Admitting: *Deleted

## 2016-07-02 DIAGNOSIS — K5903 Drug induced constipation: Secondary | ICD-10-CM | POA: Diagnosis not present

## 2016-07-02 DIAGNOSIS — K5909 Other constipation: Secondary | ICD-10-CM

## 2016-07-02 LAB — URINALYSIS, ROUTINE W REFLEX MICROSCOPIC
Bilirubin Urine: NEGATIVE
Glucose, UA: NEGATIVE mg/dL
Ketones, ur: NEGATIVE mg/dL
Leukocytes, UA: NEGATIVE
Nitrite: NEGATIVE
Protein, ur: NEGATIVE mg/dL
Specific Gravity, Urine: 1.002 — ABNORMAL LOW (ref 1.005–1.030)
pH: 7 (ref 5.0–8.0)

## 2016-07-02 MED ORDER — DOCUSATE SODIUM 250 MG PO CAPS: 250.0000 mg | ORAL_CAPSULE | Freq: Every day | ORAL | 0 refills | Status: DC

## 2016-07-02 MED ORDER — OMEPRAZOLE 20 MG PO CPDR: 20.0000 mg | DELAYED_RELEASE_CAPSULE | Freq: Every day | ORAL | 1 refills | Status: DC

## 2016-07-02 MED ORDER — DOCUSATE SODIUM 100 MG PO CAPS: 100.0000 mg | ORAL_CAPSULE | Freq: Two times a day (BID) | ORAL | 2 refills | Status: DC | PRN

## 2016-07-02 MED ORDER — FAMOTIDINE 20 MG PO TABS
20.0000 mg | ORAL_TABLET | Freq: Once | ORAL | Status: AC
  Administered 2016-07-02: 20 mg via ORAL
  Filled 2016-07-02: qty 1

## 2016-07-02 MED ORDER — POLYETHYLENE GLYCOL 3350 17 GM/SCOOP PO POWD: ORAL | 0 refills | Status: DC

## 2016-07-02 MED ORDER — IBUPROFEN 600 MG PO TABS: 600.0000 mg | ORAL_TABLET | Freq: Four times a day (QID) | ORAL | 0 refills | Status: DC | PRN

## 2016-07-02 NOTE — Progress Notes (Unsigned)
Docusate rewritten.

## 2016-07-02 NOTE — Discharge Instructions (Signed)
Constipation, Adult Constipation is when a person has fewer bowel movements in a week than normal, has difficulty having a bowel movement, or has stools that are dry, hard, or larger than normal. Constipation may be caused by an underlying condition. It may become worse with age if a person takes certain medicines and does not take in enough fluids. Follow these instructions at home: Eating and drinking    Eat foods that have a lot of fiber, such as fresh fruits and vegetables, whole grains, and beans.  Limit foods that are high in fat, low in fiber, or overly processed, such as french fries, hamburgers, cookies, candies, and soda.  Drink enough fluid to keep your urine clear or pale yellow. General instructions   Exercise regularly or as told by your health care provider.  Go to the restroom when you have the urge to go. Do not hold it in.  Take over-the-counter and prescription medicines only as told by your health care provider. These include any fiber supplements.  Practice pelvic floor retraining exercises, such as deep breathing while relaxing the lower abdomen and pelvic floor relaxation during bowel movements.  Watch your condition for any changes.  Keep all follow-up visits as told by your health care provider. This is important. Contact a health care provider if:  You have pain that gets worse.  You have a fever.  You do not have a bowel movement after 4 days.  You vomit.  You are not hungry.  You lose weight.  You are bleeding from the anus.  You have thin, pencil-like stools. Get help right away if:  You have a fever and your symptoms suddenly get worse.  You leak stool or have blood in your stool.  Your abdomen is bloated.  You have severe pain in your abdomen.  You feel dizzy or you faint. This information is not intended to replace advice given to you by your health care provider. Make sure you discuss any questions you have with your health care  provider. Document Released: 12/18/2003 Document Revised: 10/09/2015 Document Reviewed: 09/09/2015 Elsevier Interactive Patient Education  2017 Else[employer] Inc.   Gastroesophageal Reflux Disease, Adult Normally, food travels down the esophagus and stays in the stomach to be digested. If a person has gastroesophageal reflux disease (GERD), food and stomach acid move back up into the esophagus. When this happens, the esophagus becomes sore and swollen (inflamed). Over time, GERD can make small holes (ulcers) in the lining of the esophagus. Follow these instructions at home: Diet   Follow a diet as told by your doctor. You may need to avoid foods and drinks such as:  Coffee and tea (with or without caffeine).  Drinks that contain alcohol.  Energy drinks and sports drinks.  Carbonated drinks or sodas.  Chocolate and cocoa.  Peppermint and mint flavorings.  Garlic and onions.  Horseradish.  Spicy and acidic foods, such as peppers, chili powder, curry powder, vinegar, hot sauces, and BBQ sauce.  Citrus fruit juices and citrus fruits, such as oranges, lemons, and limes.  Tomato-based foods, such as red sauce, chili, salsa, and pizza with red sauce.  Fried and fatty foods, such as donuts, french fries, potato chips, and high-fat dressings.  High-fat meats, such as hot dogs, rib eye steak, sausage, ham, and bacon.  High-fat dairy items, such as whole milk, butter, and cream cheese.  Eat small meals often. Avoid eating large meals.  Avoid drinking large amounts of liquid with your meals.  Avoid eating  meals during the 2-3 hours before bedtime.  Avoid lying down right after you eat.  Do not exercise right after you eat. General instructions   Pay attention to any changes in your symptoms.  Take over-the-counter and prescription medicines only as told by your doctor. Do not take aspirin, ibuprofen, or other NSAIDs unless your doctor says it is okay.  Do not use any tobacco  products, including cigarettes, chewing tobacco, and e-cigarettes. If you need help quitting, ask your doctor.  Wear loose clothes. Do not wear anything tight around your waist.  Raise (elevate) the head of your bed about 6 inches (15 cm).  Try to lower your stress. If you need help doing this, ask your doctor.  If you are overweight, lose an amount of weight that is healthy for you. Ask your doctor about a safe weight loss goal.  Keep all follow-up visits as told by your doctor. This is important. Contact a doctor if:  You have new symptoms.  You lose weight and you do not know why it is happening.  You have trouble swallowing, or it hurts to swallow.  You have wheezing or a cough that keeps happening.  Your symptoms do not get better with treatment.  You have a hoarse voice. Get help right away if:  You have pain in your arms, neck, jaw, teeth, or back.  You feel sweaty, dizzy, or light-headed.  You have chest pain or shortness of breath.  You throw up (vomit) and your throw up looks like blood or coffee grounds.  You pass out (faint).  Your poop (stool) is bloody or black.  You cannot swallow, drink, or eat. This information is not intended to replace advice given to you by your health care provider. Make sure you discuss any questions you have with your health care provider. Document Released: 09/07/2007 Document Revised: 08/27/2015 Document Reviewed: 07/16/2014 Elsevier Interactive Patient Education  2017 [employer].

## 2016-07-02 NOTE — Progress Notes (Signed)
Written and verbal d/c instructions given and understanding voiced. Understands to wait after med to leave

## 2016-07-02 NOTE — MAU Provider Note (Signed)
History     CSN: [id number]  Arrival date and time: 07/01/16 2318   First Provider Initiated Contact with Patient 07/02/16 0009      Chief Complaint  Patient presents with  . Sterilization  . Constipation   HPI  Ms. Sabrina Dean is a 26 y.o. [id number] s/p BTL on 06/28/16 who presents to MAU today with complaint of dizziness, nausea and constipation. She is taking Percocet for pain, last dose at 1700. The patient states that she has been taking the Percocet on schedule, but ran out today. She rates her abdominal pain at 4/10 now and states it is on and off. She has noted diffuse sharp abdominal pains. She also complains of constipation and heartburn. She has not taken anything for either of these issues. She has tried Nurse, mental health with minimal relief. She states nausea without vomiting. She states that she is eating and drinking normally.   OB History    Gravida Para Term Preterm AB Living   SAB TAB Ectopic Multiple Live Births           4      Past Medical History:  Diagnosis Date  . Anemia    with pregnancy    Past Surgical History:  Procedure Laterality Date  . CESAREAN SECTION     x 4  . LAPAROSCOPIC TUBAL LIGATION Bilateral 06/28/2016   Procedure: LAPAROSCOPIC TUBAL LIGATION;  Surgeon: Reva Bores, MD;  Location: [facility];  Service: Gynecology;  Laterality: Bilateral;  . TONSILLECTOMY      History reviewed. No pertinent family history.  Social History  Substance Use Topics  . Smoking status: Never Smoker  . Smokeless tobacco: Never Used  . Alcohol use No    Allergies: No Known Allergies  No prescriptions prior to admission.    Review of Systems  Constitutional: Negative for fever.  Gastrointestinal: Positive for abdominal pain, constipation and nausea. Negative for diarrhea and vomiting.  Genitourinary: Positive for vaginal bleeding. Negative for dysuria, frequency, urgency and vaginal discharge.  Neurological: Positive for dizziness. Negative for  syncope.   Physical Exam   Blood pressure 123/71, pulse (!) 55, temperature 97.7 F (36.5 C), resp. rate 18, height 5' (1.524 m), weight 176 lb (79.8 kg), last menstrual period 06/12/2016.  Physical Exam  Nursing note and vitals reviewed. Constitutional: She is oriented to person, place, and time. She appears well-developed and well-nourished. No distress.  HENT:  Head: Normocephalic and atraumatic.  Cardiovascular: Normal rate.   Respiratory: Effort normal.  GI: Soft. She exhibits no distension and no mass. There is no tenderness. There is no rebound and no guarding.  Incision is healing well. Dressing removed.   Neurological: She is alert and oriented to person, place, and time.  Skin: Skin is warm and dry. No erythema.  Psychiatric: She has a normal mood and affect.    Results for orders placed or performed during the hospital encounter of 07/01/16 (from the past 24 hour(s))  Urinalysis, Routine w reflex microscopic     Status: Abnormal   Collection Time: 07/01/16 11:55 PM  Result Value Ref Range   Color, Urine STRAW (A) YELLOW   APPearance HAZY (A) CLEAR   Specific Gravity, Urine 1.002 (L) 1.005 - 1.030   pH 7.0 5.0 - 8.0   Glucose, UA NEGATIVE NEGATIVE mg/dL   Hgb urine dipstick MODERATE (A) NEGATIVE   Bilirubin Urine NEGATIVE NEGATIVE   Ketones, ur NEGATIVE NEGATIVE mg/dL  Protein, ur NEGATIVE NEGATIVE mg/dL   Nitrite NEGATIVE NEGATIVE   Leukocytes, UA NEGATIVE NEGATIVE   RBC / HPF 0-5 0 - 5 RBC/hpf   WBC, UA 0-5 0 - 5 WBC/hpf   Bacteria, UA RARE (A) NONE SEEN   Squamous Epithelial / LPF 6-30 (A) NONE SEEN    MAU Course  Procedures None  MDM Pepcid given in MAU  Assessment and Plan  A: POD #3 s/p BTL Constipation Dizziness, intermittent Heartburn  P:  Discharge home Rx for Prilosec, Colace, Ibuprofen and Miralax given to patient Warning signs for worsening condition discussed Advised increased PO hydration, rest and dietary fiber Patient advised to  follow-up with [facility] for routine post op Patient may return to MAU as needed or if her condition were to change or worsen  Marny Lowenstein, PA-C  07/02/2016, 2:25 AM

## 2016-07-19 ENCOUNTER — Ambulatory Visit (INDEPENDENT_AMBULATORY_CARE_PROVIDER_SITE_OTHER): Payer: Medicaid Other | Admitting: Family Medicine

## 2016-07-19 ENCOUNTER — Encounter: Payer: Self-pay | Admitting: Family Medicine

## 2016-07-19 VITALS — BP 120/78 | HR 59 | Ht 60.0 in | Wt 178.0 lb

## 2016-07-19 DIAGNOSIS — Z09 Encounter for follow-up examination after completed treatment for conditions other than malignant neoplasm: Secondary | ICD-10-CM

## 2016-07-19 NOTE — Progress Notes (Signed)
   Subjective:    Patient ID: Sabrina Dean is a 26 y.o. female presenting with Routine Post Op  on 07/19/2016  HPI: Patient is here for post op check. She is s/p BTL. Doing well.  Review of Systems  Constitutional: Negative for chills and fever.  Respiratory: Negative for shortness of breath.   Cardiovascular: Negative for chest pain.  Gastrointestinal: Negative for abdominal pain, nausea and vomiting.  Genitourinary: Negative for dysuria.  Skin: Negative for rash.      Objective:    BP 120/78   Pulse (!) 59   Ht 5' (1.524 m)   Wt 178 lb (80.7 kg)   BMI 34.76 kg/m  Physical Exam  Constitutional: She is oriented to person, place, and time. She appears well-developed and well-nourished. No distress.  HENT:  Head: Normocephalic and atraumatic.  Eyes: No scleral icterus.  Neck: Neck supple.  Cardiovascular: Normal rate.   Pulmonary/Chest: Effort normal.  Abdominal: Soft.  Neurological: She is alert and oriented to person, place, and time.  Skin: Skin is warm and dry.  Umbilicus well healed with some glue still there  Psychiatric: She has a normal mood and affect.        Assessment & Plan:   Problem List Items Addressed This Visit    None    Visit Diagnoses    Postop check    -  Primary   Doing well--clean umbilicus with H2O2 bid.     Return if symptoms worsen or fail to improve.  Reva Bores 07/19/2016 12:58 PM

## 2016-07-19 NOTE — Patient Instructions (Signed)
Laparoscopic Tubal Ligation, Care After °Refer to this sheet in the next few weeks. These instructions provide you with information about caring for yourself after your procedure. Your health care provider may also give you more specific instructions. Your treatment has been planned according to current medical practices, but problems sometimes occur. Call your health care provider if you have any problems or questions after your procedure. °What can I expect after the procedure? °After the procedure, it is common to have: °· A sore throat. °· Discomfort in your shoulder. °· Mild discomfort or cramping in your abdomen. °· Gas pains. °· Pain or soreness in the area where the surgical cut (incision) was made. °· A bloated feeling. °· Tiredness. °· Nausea. °· Vomiting. °Follow these instructions at home: °Medicines °· Take over-the-counter and prescription medicines only as told by your health care provider. °· Do not take aspirin because it can cause bleeding. °· Do not drive or operate heavy machinery while taking prescription pain medicine. °Activity °· Rest for the rest of the day. °· Return to your normal activities as told by your health care provider. Ask your health care provider what activities are safe for you. °Incision care °· Follow instructions from your health care provider about how to take care of your incision. Make sure you: °¨ Wash your hands with soap and water before you change your bandage (dressing). If soap and water are not available, use hand sanitizer. °¨ Change your dressing as told by your health care provider. °¨ Leave stitches (sutures) in place. They may need to stay in place for 2 weeks or longer. °· Check your incision area every day for signs of infection. Check for: °¨ More redness, swelling, or pain. °¨ More fluid or blood. °¨ Warmth. °¨ Pus or a bad smell. °Other Instructions °· Do not take baths, swim, or use a hot tub until your health care provider approves. You may take  showers. °· Keep all follow-up visits as told by your health care provider. This is important. °· Have someone help you with your daily household tasks for the first few days. °Contact a health care provider if: °· You have more redness, swelling, or pain around your incision. °· Your incision feels warm to the touch. °· You have pus or a bad smell coming from your incision. °· The edges of your incision break open after the sutures have been removed. °· Your pain does not improve after 2-3 days. °· You have a rash. °· You repeatedly become dizzy or light-headed. °· Your pain medicine is not helping. °· You are constipated. °Get help right away if: °· You have a fever. °· You faint. °· You have increasing pain in your abdomen. °· You have severe pain in one or both of your shoulders. °· You have fluid or blood coming from your sutures or from your vagina. °· You have shortness of breath or difficulty breathing. °· You have chest pain or leg pain. °· You have ongoing nausea, vomiting, or diarrhea. °This information is not intended to replace advice given to you by your health care provider. Make sure you discuss any questions you have with your health care provider. °Document Released: 10/08/2004 Document Revised: 08/24/2015 Document Reviewed: 03/01/2015 °Elsevier Interactive Patient Education © 2017 Elsevier Inc. ° °

## 2016-11-24 ENCOUNTER — Inpatient Hospital Stay (HOSPITAL_COMMUNITY)
Admission: AD | Admit: 2016-11-24 | Discharge: 2016-11-24 | Disposition: A | Discharge status: Home / Self Care | Payer: Medicaid Other | Source: Ambulatory Visit | Attending: Obstetrics & Gynecology | Admitting: Obstetrics & Gynecology

## 2016-11-24 ENCOUNTER — Encounter (HOSPITAL_COMMUNITY): Payer: Self-pay | Admitting: Advanced Practice Midwife

## 2016-11-24 DIAGNOSIS — L299 Pruritus, unspecified: Secondary | ICD-10-CM | POA: Diagnosis not present

## 2016-11-24 DIAGNOSIS — L210 Seborrhea capitis: Secondary | ICD-10-CM

## 2016-11-24 DIAGNOSIS — R51 Headache: Secondary | ICD-10-CM | POA: Insufficient documentation

## 2016-11-24 LAB — URINALYSIS, ROUTINE W REFLEX MICROSCOPIC
BILIRUBIN URINE: NEGATIVE
GLUCOSE, UA: NEGATIVE mg/dL
Hgb urine dipstick: NEGATIVE
Ketones, ur: NEGATIVE mg/dL
Leukocytes, UA: NEGATIVE
NITRITE: NEGATIVE
PH: 8 (ref 5.0–8.0)
Protein, ur: NEGATIVE mg/dL
SPECIFIC GRAVITY, URINE: 1.016 (ref 1.005–1.030)

## 2016-11-24 LAB — POCT PREGNANCY, URINE: Preg Test, Ur: NEGATIVE

## 2016-11-24 MED ORDER — TRIAMCINOLONE ACETONIDE 0.5 % EX OINT: 1.0000 "application " | TOPICAL_OINTMENT | Freq: Two times a day (BID) | CUTANEOUS | 0 refills | Status: AC

## 2016-11-24 NOTE — Discharge Instructions (Signed)
Seborrheic Dermatitis, Adult Seborrheic dermatitis is a skin disease that causes red, scaly patches. It usually occurs on the scalp, and it is often called dandruff. The patches may appear on other parts of the body. Skin patches tend to appear where there are many oil glands in the skin. Areas of the body that are commonly affected include:  Scalp.  Skin folds of the body.  Ears.  Eyebrows.  Neck.  Face.  Armpits.  The bearded area of men's faces.  The condition may come and go for no known reason, and it is often long-lasting (chronic). What are the causes? The cause of this condition is not known. What increases the risk? This condition is more likely to develop in people who:  Have certain conditions, such as: ? HIV (human immunodeficiency virus). ? AIDS (acquired immunodeficiency syndrome). ? Parkinson disease. ? Mood disorders, such as depression.  Are 40-60 years old.  What are the signs or symptoms? Symptoms of this condition include:  Thick scales on the scalp.  Redness on the face or in the armpits.  Skin that is flaky. The flakes may be white or yellow.  Skin that seems oily or dry but is not helped with moisturizers.  Itching or burning in the affected areas.  How is this diagnosed? This condition is diagnosed with a medical history and physical exam. A sample of your skin may be tested (skin biopsy). You may need to see a skin specialist (dermatologist). How is this treated? There is no cure for this condition, but treatment can help to manage the symptoms. You may get treatment to remove scales, lower the risk of skin infection, and reduce swelling or itching. Treatment may include:  Creams that reduce swelling and irritation (steroids).  Creams that reduce skin yeast.  Medicated shampoo, soaps, moisturizing creams, or ointments.  Medicated moisturizing creams or ointments.  Follow these instructions at home:  Apply over-the-counter and  prescription medicines only as told by your health care provider.  Use any medicated shampoo, soaps, skin creams, or ointments only as told by your health care provider.  Keep all follow-up visits as told by your health care provider. This is important. Contact a health care provider if:  Your symptoms do not improve with treatment.  Your symptoms get worse.  You have new symptoms. This information is not intended to replace advice given to you by your health care provider. Make sure you discuss any questions you have with your health care provider. Document Released: 03/21/2005 Document Revised: 10/09/2015 Document Reviewed: 07/09/2015 Elsevier Interactive Patient Education  2018 Elsevier Inc.  

## 2016-11-24 NOTE — MAU Note (Signed)
+  headaches--more in temples Rating pain 5/10 "minor to not be able to carry on my day"   +itching scalp with rash Has had problems in the past  +hair loss

## 2016-11-24 NOTE — MAU Provider Note (Signed)
History  Chief Complaint:  Headache and itching scalp  Sabrina Dean is a 26 y.o. [id number] female presenting with itchiness and headaches. She states that she started feeling pain after she washed her hair and she felt some pain after this that has turned into itchiness. She describes the initial symptom as burning/stinging. Headaches started at the same time rash started. She has tried moisturizers with cocoa. She denies blurry vision, flashing lights, denies pain with eye movement. Denies dizziness. She states that she can feel swollen LNs on her R posterior scalp. History of folliculitis for 3y that went away but just recently started having itching.   She washes and re-braids her hair every 2 weeks. She currently has a Armed forces operational officer and last saw him last month, next appointment is in October.   Obstetrical History: OB History    Gravida Para Term Preterm AB Living   5 4 4   1 4    SAB TAB Ectopic Multiple Live Births           4      Past Medical History: Past Medical History:  Diagnosis Date  . Anemia    with pregnancy    Past Surgical History: Past Surgical History:  Procedure Laterality Date  . CESAREAN SECTION     x 4  . LAPAROSCOPIC TUBAL LIGATION Bilateral 06/28/2016   Procedure: LAPAROSCOPIC TUBAL LIGATION;  Surgeon: Reva Bores, MD;  Location: [facility];  Service: Gynecology;  Laterality: Bilateral;  . TONSILLECTOMY      Social History: Social History   Social History  . Marital status: Married    Spouse name: N/A  . Number of children: N/A  . Years of education: N/A   Social History Main Topics  . Smoking status: Never Smoker  . Smokeless tobacco: Never Used  . Alcohol use No  . Drug use: No  . Sexual activity: Yes    Birth control/ protection: Rhythm, Surgical   Other Topics Concern  . Not on file   Social History Narrative  . No narrative on file    Allergies: No Known Allergies  Prescriptions Prior to Admission  Medication Sig Dispense Refill  Last Dose  . OVER THE COUNTER MEDICATION Take 2 tablets by mouth 2 (two) times daily. Weight loss supplement   Not Taking    Review of Systems  Pertinent pos/neg as indicated in HPI  Physical Exam  Blood pressure 90/63, pulse 66, temperature 98.3 F (36.8 C), temperature source Oral, resp. rate 16, weight 81.2 kg (179 lb), last menstrual period 11/19/2016, SpO2 100 %. General appearance: alert, cooperative and no distress Lungs: normal work of breathing Abdomen: gravid, soft, non-tender Skin: Dry, scaly skin appreciated on the posterior scalp, tight braids present on entire scalp, no areas of alopecia, no lice or nits noticed  MAU Course  MDM History and physical examination UPT: negative Patient verbalized agreement and understanding with the assessment and plan.  Labs:  Results for orders placed or performed during the hospital encounter of 11/24/16 (from the past 24 hour(s))  Urinalysis, Routine w reflex microscopic     Status: None   Collection Time: 11/24/16  4:01 PM  Result Value Ref Range   Color, Urine YELLOW YELLOW   APPearance CLEAR CLEAR   Specific Gravity, Urine 1.016 1.005 - 1.030   pH 8.0 5.0 - 8.0   Glucose, UA NEGATIVE NEGATIVE mg/dL   Hgb urine dipstick NEGATIVE NEGATIVE   Bilirubin Urine NEGATIVE NEGATIVE   Ketones, ur NEGATIVE NEGATIVE mg/dL  Protein, ur NEGATIVE NEGATIVE mg/dL   Nitrite NEGATIVE NEGATIVE   Leukocytes, UA NEGATIVE NEGATIVE  Pregnancy, urine POC     Status: None   Collection Time: 11/24/16  4:22 PM  Result Value Ref Range   Preg Test, Ur NEGATIVE NEGATIVE    Imaging:  None  Assessment and Plan  A: Sabrina Dean is a 26yo [id number] who presents complaining of headaches and scalp itching. History and physical exam consistent with seborrheic dermatitis. No evidence of lice or tinea capitis.   P:  Seborrheic dermatitis: Pt prescribed .5% triamcinolone ointment to aid with itching. Instructed to take ibuprofen for headache management as  needed. Return precautions given, patient instructed to go to Urgent Care, PCP or [place] with additional symptoms related to current issue. Patient advised to follow up with PCP or dermatologist for further management.    Sabrina Dean, Medical Student 8/23/20185:19 PM  I confirm that I have verified the information documented in the student's note and that I have also personally reperformed the physical exam and all medical decision making activities. Luna Kitchens

## 2018-01-17 ENCOUNTER — Encounter (HOSPITAL_COMMUNITY): Payer: Self-pay | Admitting: Behavioral Health

## 2018-01-17 ENCOUNTER — Ambulatory Visit (HOSPITAL_COMMUNITY)
Admission: AD | Admit: 2018-01-17 | Discharge: 2018-01-17 | Disposition: A | Discharge status: Home / Self Care | Payer: Medicaid Other | Attending: Psychiatry | Admitting: Psychiatry
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
## 2018-01-17 NOTE — H&P (Signed)
Behavioral Health Medical Screening Exam  Sabrina Dean is an 27 y.o. female patient [facility] a [facility] with complaints of religious visions.  Patient states that nothing is negative; but her husband is having visions also and that he has become paranoid, isolating and she just wanted them both to be assessed and referrals to outpatient psychiatry.  States that she is more worried about her husband and that he was diagnosed with paranoid schizophrenia when in [place] and it has gotten worse.    Total Time spent with patient: 30 minutes  Psychiatric Specialty Exam: Physical Exam  Vitals reviewed. Constitutional: She is oriented to person, place, and time. She appears well-developed and well-nourished. No distress.  HENT:  Head: Normocephalic.  Neck: Normal range of motion. Neck supple.  Respiratory: Effort normal.  Musculoskeletal: Normal range of motion.  Neurological: She is alert and oriented to person, place, and time.  Skin: Skin is warm and dry.  Psychiatric: She has a normal mood and affect. Her speech is normal. Judgment and thought content normal. She is actively hallucinating. Cognition and memory are normal.    Review of Systems  Psychiatric/Behavioral: Positive for hallucinations (States that she has religious visions ; hearing and talking to God and angles.  Deneis command and states that they are not negative.  ). Negative for depression, memory loss, substance abuse and suicidal ideas. The patient is not nervous/anxious and does not have insomnia.   All other systems reviewed and are negative.   Blood pressure 122/81, pulse 73, temperature 98.4 F (36.9 C), resp. rate 16, SpO2 100 %.There is no height or weight on file to calculate BMI.  General Appearance: Casual and Neat  Eye Contact:  Good  Speech:  Clear and Coherent and Normal Rate  Volume:  Normal  Mood:  Appopriate  Affect:  Appropriate and Congruent  Thought Process:  Coherent and Goal Directed   Orientation:  Full (Time, Place, and Person)  Thought Content:  Hallucinations: Auditory Visual  Suicidal Thoughts:  No  Homicidal Thoughts:  No  Memory:  Immediate;   Good Recent;   Good Remote;   Good  Judgement:  Intact  Insight:  Present  Psychomotor Activity:  Normal  Concentration: Concentration: Good and Attention Span: Good  Recall:  Good  Fund of Knowledge:Good  Language: Good  Akathisia:  No  Handed:  Right  AIMS (if indicated):     Assets:  Communication Skills Desire for Improvement Housing Intimacy Physical Health Social Support  Sleep:       Musculoskeletal: Strength & Muscle Tone: within normal limits Gait & Station: normal Patient leans: N/A  Blood pressure 122/81, pulse 73, temperature 98.4 F (36.9 C), resp. rate 16, SpO2 100 %.  Recommendations:  Outpatient psychiatric services  Disposition: No evidence of imminent risk to self or others at present.   Supportive therapy provided about ongoing stressors. Discussed crisis plan, support from social network, calling 911, coming to the Emergency Department, and calling Suicide Hotline. Based on my evaluation the patient does not appear to have an emergency medical condition.  Shuvon Rankin, NP 01/17/2018, 5:37 PM

## 2018-02-10 IMAGING — US US OB COMP LESS 14 WK
1 series · 12 of 12 positions shown · non-contrast
Comparison: None.

CLINICAL DATA: Abdominal pain. Beta HCG pending at the time of this
examination.

EXAM:
OBSTETRIC <14 WK ULTRASOUND
TECHNIQUE: Transabdominal ultrasound was performed for evaluation of the
gestation as well as the maternal uterus and adnexal regions.

[Series 1: us ob comp less 14 wk · 12 acquisitions, 12 frames shown]
[im 1/12]
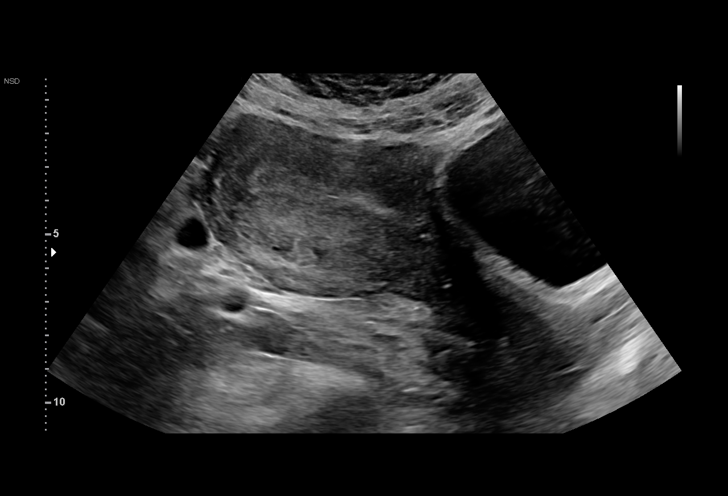
[im 2/12]
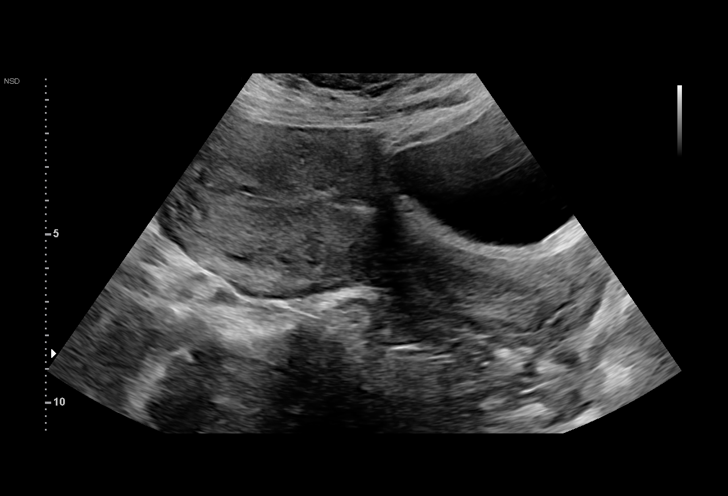
[im 3/12]
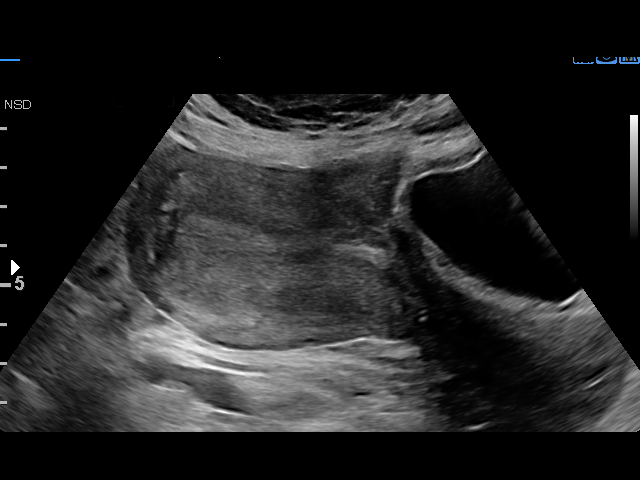
[im 4/12]
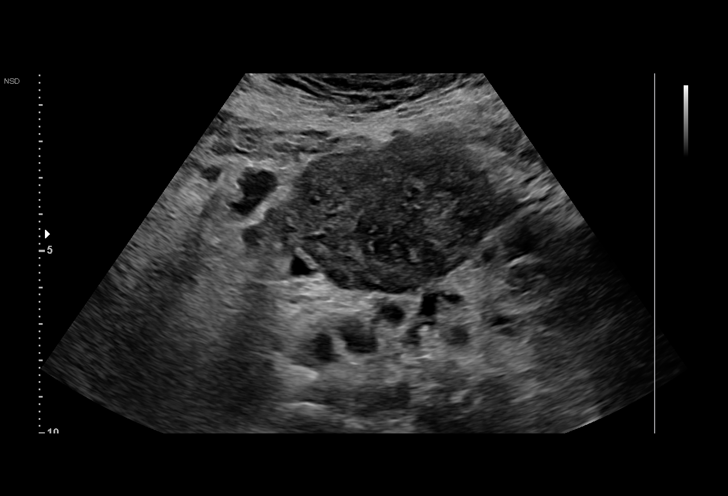
[im 5/12]
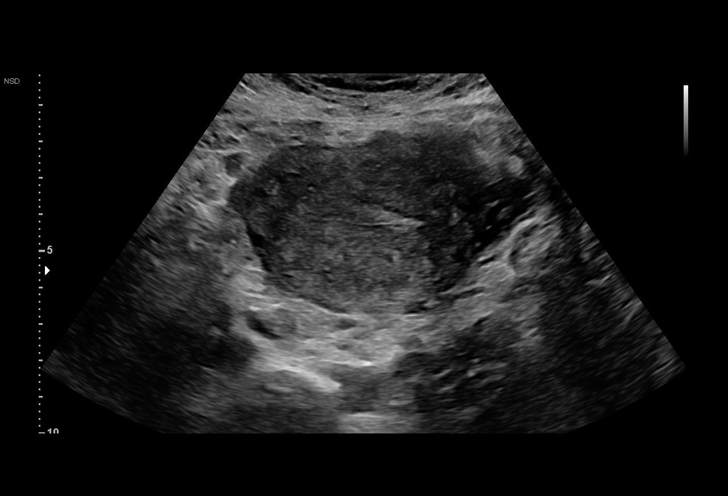
[im 6/12]
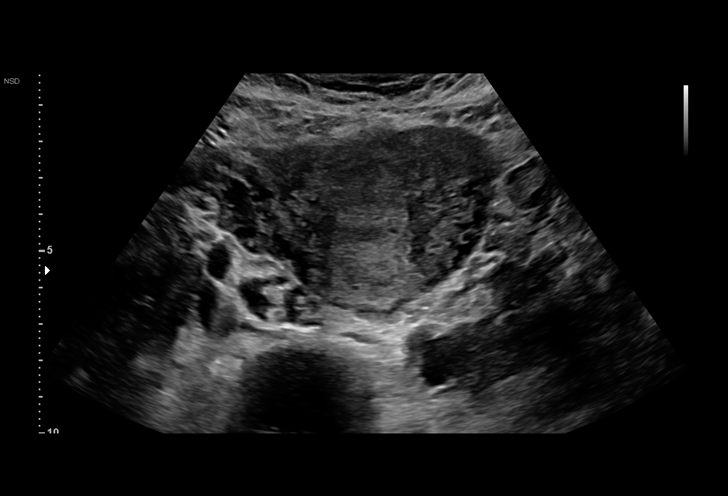
[im 7/12]
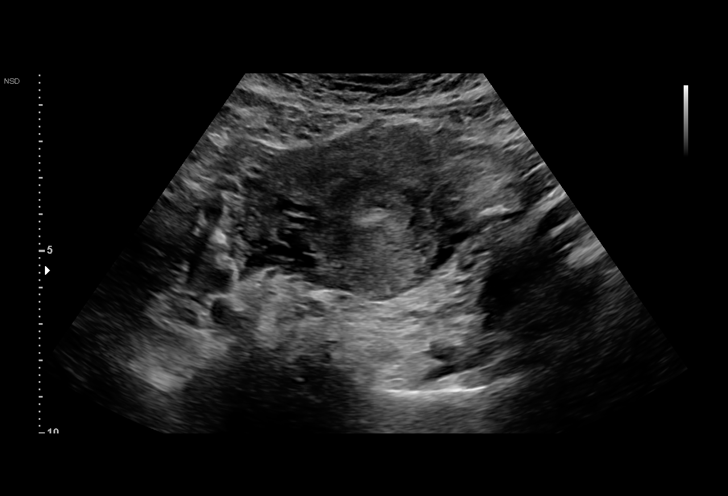
[im 8/12]
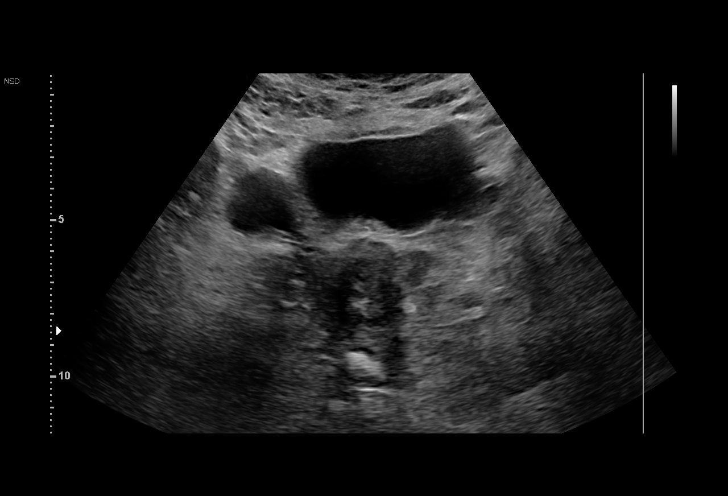
[im 9/12]
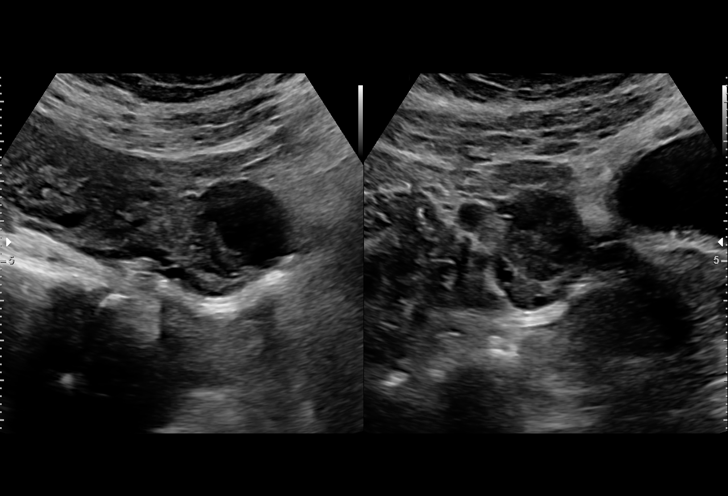
[im 10/12]
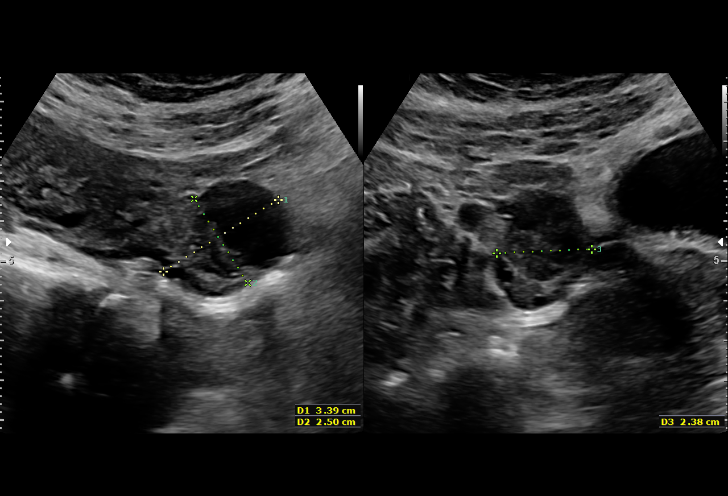
[im 11/12]
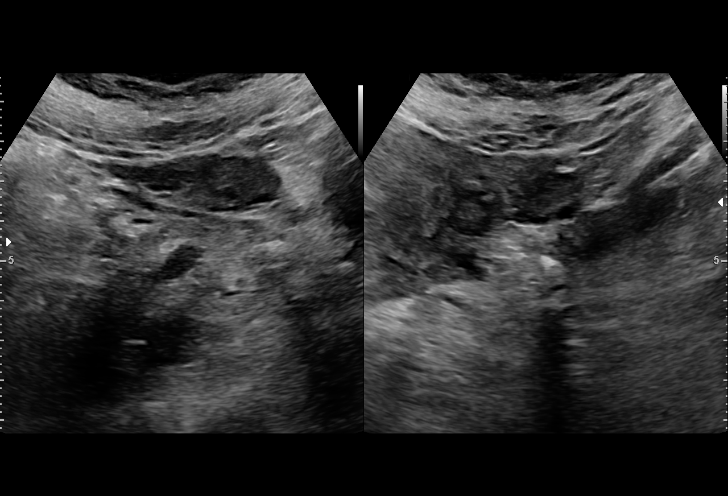
[im 12/12]
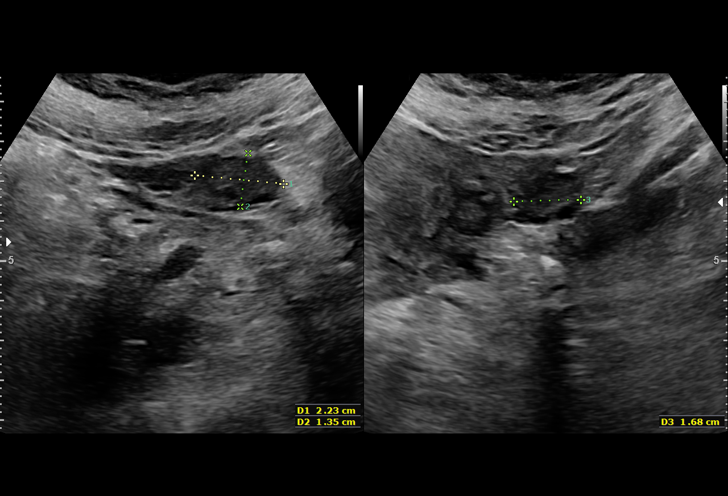

[12 of 12 positions shown; findings below may reference images not displayed]

FINDINGS: Intrauterine gestational sac: Not visible

Yolk sac:  Not visible

Embryo:  Not visible

Cardiac Activity: Not visible

Heart Rate:  bpm

MSD:   mm    w     d

CRL:     mm    w  d                  US EDC:

Subchorionic hemorrhage:  None visualized.

Maternal uterus/adnexae: Normal ovaries. Right ovarian corpus luteal
cyst incidentally noted. No abnormal pelvic fluid collections.
IMPRESSION: No visible gestational sac. No abnormal pelvic fluid collections.
Careful follow-up is necessary.

## 2018-02-27 ENCOUNTER — Ambulatory Visit: Payer: Self-pay | Admitting: Family Medicine

## 2018-04-07 ENCOUNTER — Emergency Department (HOSPITAL_COMMUNITY)
Admission: EM | Admit: 2018-04-07 | Discharge: 2018-04-07 | Disposition: A | Discharge status: Home / Self Care | Payer: Medicaid Other | Attending: Emergency Medicine | Admitting: Emergency Medicine

## 2018-04-07 ENCOUNTER — Encounter (HOSPITAL_COMMUNITY): Payer: Self-pay

## 2018-04-07 ENCOUNTER — Emergency Department (HOSPITAL_COMMUNITY): Payer: Medicaid Other

## 2018-04-07 DIAGNOSIS — B9789 Other viral agents as the cause of diseases classified elsewhere: Secondary | ICD-10-CM

## 2018-04-07 DIAGNOSIS — R0981 Nasal congestion: Secondary | ICD-10-CM | POA: Insufficient documentation

## 2018-04-07 DIAGNOSIS — M7918 Myalgia, other site: Secondary | ICD-10-CM | POA: Insufficient documentation

## 2018-04-07 DIAGNOSIS — R51 Headache: Secondary | ICD-10-CM | POA: Diagnosis not present

## 2018-04-07 DIAGNOSIS — J069 Acute upper respiratory infection, unspecified: Secondary | ICD-10-CM | POA: Diagnosis not present

## 2018-04-07 DIAGNOSIS — R05 Cough: Secondary | ICD-10-CM | POA: Diagnosis present

## 2018-04-07 LAB — POC URINE PREG, ED: Preg Test, Ur: NEGATIVE

## 2018-04-07 MED ORDER — BENZONATATE 100 MG PO CAPS: 100.0000 mg | ORAL_CAPSULE | Freq: Three times a day (TID) | ORAL | 0 refills | Status: AC

## 2018-04-07 MED ORDER — ACETAMINOPHEN 500 MG PO TABS
1000.0000 mg | ORAL_TABLET | Freq: Once | ORAL | Status: AC
Start: 2018-04-07 — End: 2018-04-07
  Administered 2018-04-07: 1000 mg via ORAL
  Filled 2018-04-07: qty 2

## 2018-04-07 NOTE — ED Provider Notes (Signed)
[facility]   CSN: [id number] Arrival date & time: 04/07/18  1252     History   Chief Complaint Chief Complaint  Patient presents with  . Cough  . Nasal Congestion  . Generalized Body Aches    HPI Sabrina Dean is a 28 y.o. female.  Sabrina Dean is a 28 y.o. female who is otherwise healthy, presents to the emergency department for evaluation of 4 days of body aches, chills, cough and congestion.  Since onset symptoms have been constant and not improving.  She denies any associated chest pain or shortness of breath.  Reports cough is occasionally productive of clear sputum.  She also reports a mild headache, no neck stiffness or pain.  No nausea, vomiting or diarrhea, no abdominal pain.  Her son was recently sick with similar symptoms and is since gotten better.  She has not taken anything to treat her symptoms prior to arrival, denies any other aggravating or alleviating factors.     Past Medical History:  Diagnosis Date  . Anemia    with pregnancy    There are no active problems to display for this patient.   Past Surgical History:  Procedure Laterality Date  . CESAREAN SECTION     x 4  . LAPAROSCOPIC TUBAL LIGATION Bilateral 06/28/2016   Procedure: LAPAROSCOPIC TUBAL LIGATION;  Surgeon: Reva Boresanya S Pratt, MD;  Location: [facility];  Service: Gynecology;  Laterality: Bilateral;  . TONSILLECTOMY       OB History    Gravida  5   Para  4   Term  4   Preterm      AB  1   Living  4     SAB      TAB      Ectopic      Multiple      Live Births  4            Home Medications    Prior to Admission medications   Medication Sig Start Date End Date Taking? Authorizing Provider  triamcinolone ointment (KENALOG) 0.5 % Apply 1 application topically 2 (two) times daily. Patient not taking: Reported on 04/07/2018 11/24/16   Marylene LandKooistra, Kathryn Lorraine, CNM    Family History No family history on file.  Social  History Social History   Tobacco Use  . Smoking status: Never Smoker  . Smokeless tobacco: Never Used  Substance Use Topics  . Alcohol use: No  . Drug use: No     Allergies   Patient has no known allergies.   Review of Systems Review of Systems  Constitutional: Negative for chills and fever.  HENT: Positive for congestion, postnasal drip, rhinorrhea and sore throat.   Eyes: Negative for visual disturbance.  Respiratory: Positive for cough. Negative for chest tightness, shortness of breath and wheezing.   Cardiovascular: Negative for chest pain.  Gastrointestinal: Negative for abdominal pain, nausea and vomiting.  Genitourinary: Negative for dysuria and frequency.  Musculoskeletal: Negative for arthralgias and myalgias.  Skin: Negative for color change and rash.  Neurological: Positive for headaches. Negative for dizziness, syncope and light-headedness.     Physical Exam Updated Vital Signs BP (!) 142/123 (BP Location: Right Arm)   Pulse (!) 102   Temp 98.9 F (37.2 C) (Oral)   Resp 18   SpO2 100%   Physical Exam Vitals signs and nursing note reviewed.  Constitutional:      General: She is not in acute distress.  Appearance: She is well-developed. She is not ill-appearing or diaphoretic.  HENT:     Head: Normocephalic and atraumatic.     Right Ear: Tympanic membrane normal.     Left Ear: Tympanic membrane normal.     Nose: Congestion and rhinorrhea present.     Comments: Moderate mucosal edema present with clear rhinorrhea laterally    Mouth/Throat:     Mouth: Mucous membranes are moist.     Pharynx: Oropharynx is clear. Posterior oropharyngeal erythema present. No oropharyngeal exudate.     Comments: Posterior oropharynx clear and moist, mild erythema, no edema or exudates, uvula midline, tolerating secretions without difficulty, normal phonation Eyes:     General:        Right eye: No discharge.        Left eye: No discharge.  Neck:     Musculoskeletal:  Neck supple. No neck rigidity.     Comments: No rigidity Cardiovascular:     Rate and Rhythm: Normal rate and regular rhythm.     Heart sounds: Normal heart sounds.  Pulmonary:     Effort: Pulmonary effort is normal. No respiratory distress.     Breath sounds: Normal breath sounds.     Comments: Respirations equal and unlabored, patient able to speak in full sentences, lungs clear to auscultation bilaterally Abdominal:     General: Bowel sounds are normal. There is no distension.     Palpations: Abdomen is soft. There is no mass.     Tenderness: There is no abdominal tenderness. There is no guarding.     Comments: Abdomen soft, nondistended, nontender to palpation in all quadrants without guarding or peritoneal signs  Musculoskeletal:        General: No deformity.  Lymphadenopathy:     Cervical: No cervical adenopathy.  Skin:    General: Skin is warm and dry.     Capillary Refill: Capillary refill takes less than 2 seconds.  Neurological:     Mental Status: She is alert.  Psychiatric:        Mood and Affect: Mood normal.        Behavior: Behavior normal.      ED Treatments / Results  Labs (all labs ordered are listed, but only abnormal results are displayed) Labs Reviewed  POC URINE PREG, ED    EKG None  Radiology Dg Chest 2 View  Result Date: 04/07/2018 CLINICAL DATA:  Cough, headache, and congestion for 4 days EXAM: CHEST - 2 VIEW COMPARISON:  None FINDINGS: Normal heart size, mediastinal contours, and pulmonary vascularity. Lungs clear. No pleural effusion or pneumothorax. Bones unremarkable. IMPRESSION: Normal exam. Electronically Signed   By: Ulyses Southward M.D.   On: 04/07/2018 16:36    Procedures Procedures (including critical care time)  Medications Ordered in ED Medications  acetaminophen (TYLENOL) tablet 1,000 mg (1,000 mg Oral Given 04/07/18 1620)     Initial Impression / Assessment and Plan / ED Course  I have reviewed the triage vital signs and the  nursing notes.  Pertinent labs & imaging results that were available during my care of the patient were reviewed by me and considered in my medical decision making (see chart for details).  Pt presents with nasal congestion and cough. Pt is well appearing and vitals are normal. Lungs CTA on exam. Pt CXR negative for acute infiltrate. Patients symptoms are consistent with URI, likely viral etiology. Discussed that antibiotics are not indicated for viral infections. Pt will be discharged with symptomatic treatment.  Verbalizes understanding  and is agreeable with plan. Pt is hemodynamically stable & in NAD prior to dc. Return precautions discussed, pt expresses understanding and agrees with plan.   Final Clinical Impressions(s) / ED Diagnoses   Final diagnoses:  Viral URI with cough    ED Discharge Orders         Ordered    benzonatate (TESSALON) 100 MG capsule  Every 8 hours     04/07/18 1703           Dartha Lodge, PA-C 04/07/18 1709    Tilden Fossa, MD 04/09/18 [phone]

## 2018-04-07 NOTE — ED Triage Notes (Signed)
Patient complains of 4 days of body aches, chills, cough and congestion. Has not taken otc meds. NAD

## 2018-04-07 NOTE — Discharge Instructions (Signed)
Your symptoms are likely caused by a viral upper respiratory infection. Antibiotics are not helpful in treating viral infection, the virus should run its course in about 5-7 days. Please make sure you are drinking plenty of fluids. You can treat your symptoms supportively with tylenol/ibuprofen for fevers and pains, Zyrtec and Flonase to help with nasal congestion, and prescribed cough medication and throat lozenges to help with cough. If your symptoms are not improving please follow up with you Primary doctor.  ° °If you develop persistent fevers, shortness of breath or difficulty breathing, chest pain, severe headache and neck pain, persistent nausea and vomiting or other new or concerning symptoms return to the Emergency department. ° °

## 2018-04-07 NOTE — ED Notes (Signed)
Med given urine collected

## 2018-04-07 NOTE — ED Notes (Signed)
PT ILL FOR  4 days no temp  She thinks she has the flu
